# Patient Record
Sex: Female | Born: 1970
Health system: Southern US, Community
[De-identification: ages and names within clinical notes are randomized; demographics above are authoritative.]

## PROBLEM LIST (undated history)

## (undated) DIAGNOSIS — I10 Essential (primary) hypertension: Secondary | ICD-10-CM

## (undated) DIAGNOSIS — K754 Autoimmune hepatitis: Secondary | ICD-10-CM

## (undated) HISTORY — DX: Autoimmune hepatitis: K75.4

## (undated) HISTORY — PX: CHOLECYSTECTOMY: SHX55

## (undated) HISTORY — DX: Essential (primary) hypertension: I10

---

## 2007-06-27 ENCOUNTER — Encounter (INDEPENDENT_AMBULATORY_CARE_PROVIDER_SITE_OTHER): Payer: Self-pay | Admitting: Diagnostic Radiology

## 2007-06-27 ENCOUNTER — Ambulatory Visit (HOSPITAL_COMMUNITY): Admission: RE | Admit: 2007-06-27 | Discharge: 2007-06-27 | Payer: Self-pay | Admitting: Diagnostic Radiology

## 2007-07-03 ENCOUNTER — Encounter: Payer: Self-pay | Admitting: Internal Medicine

## 2007-07-09 ENCOUNTER — Encounter: Payer: Self-pay | Admitting: Internal Medicine

## 2007-07-20 ENCOUNTER — Encounter: Payer: Self-pay | Admitting: Internal Medicine

## 2008-03-21 IMAGING — US US BIOPSY
1 series · 13 of 25 positions shown · non-contrast
Comparison: none

This report is delayed due to PACS failure.
CLINICAL DATA: Acute hepatitis.  
ULTRASOUND GUIDED LIVER BIOPSY 06/27/07:
Medications:  Versed 2 mg, fentanyl 100 mcg.
Procedure:  The procedure was discussed with the patient in depth.  Patient?s INR at the time of the procedure was 1.5. Patient was explained the risks of bleeding for this procedure.  Informed consent was obtained.  A time-out was performed prior to the procedure.  The abdomen was evaluated with ultrasound.  Patient was found to have very echogenic portal venous branches suggestive for underlying liver edema and consistent with hepatitis.  The right hepatic lobe was felt to be most accessible for safe biopsy.  The patient?s right abdomen was prepped and draped in a sterile fashion.  The skin was anesthetized with 1% lidocaine.  A 17-gauge needle was directed into the right hepatic lobe and three core biopsies were obtained with an 18-gauge device through the 17-gauge needle.  The tract was then embolized using gel foam torpedoes.  Patient tolerated the procedure well.  No significant bleeding following the procedure.

[Series 1: unknown · 0.30mm/px · 13 of 31 slices shown]
[im 1/31]
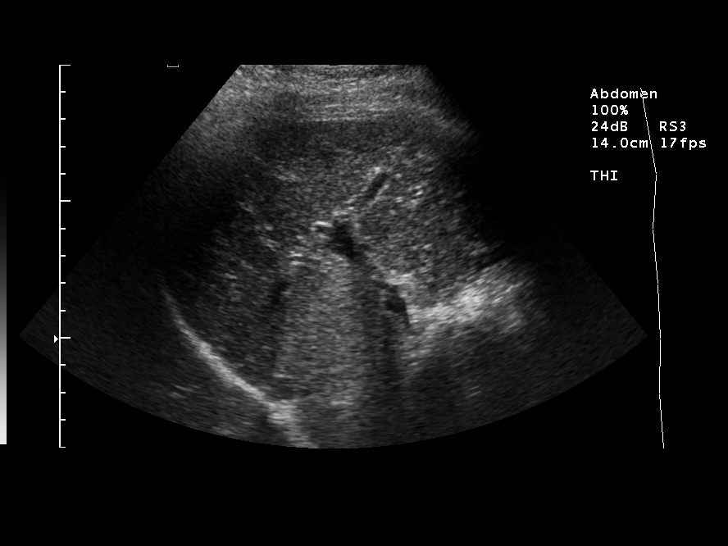
[im 3/31]
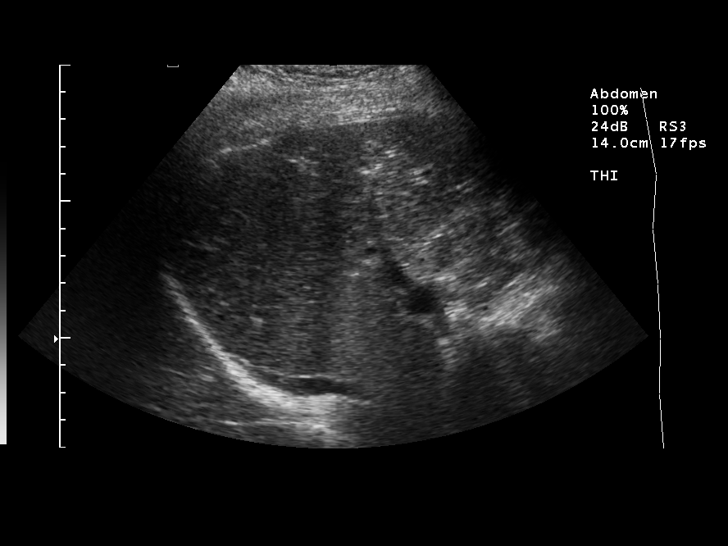
[im 6/31]
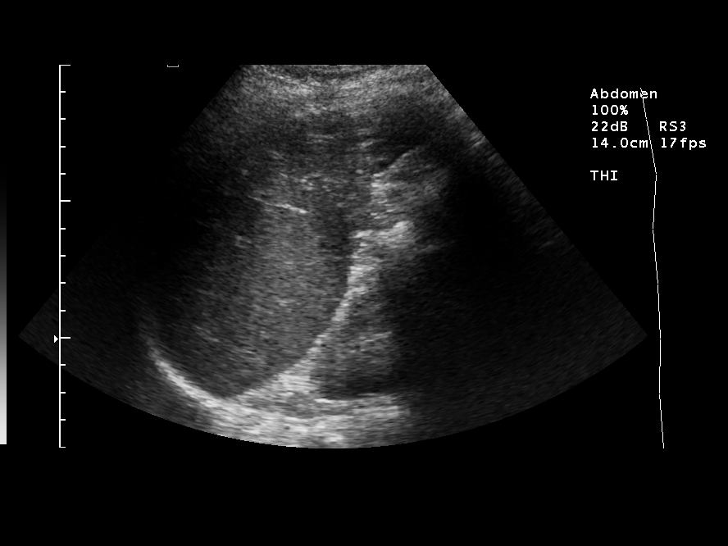
[im 8/31]
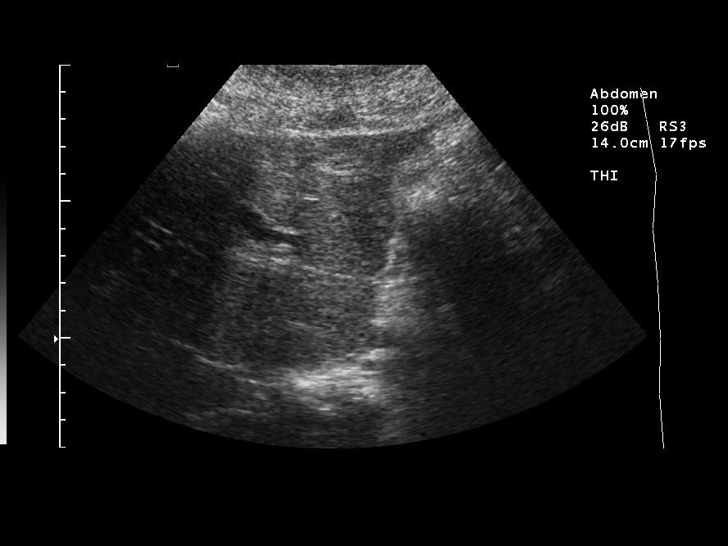
[im 11/31]
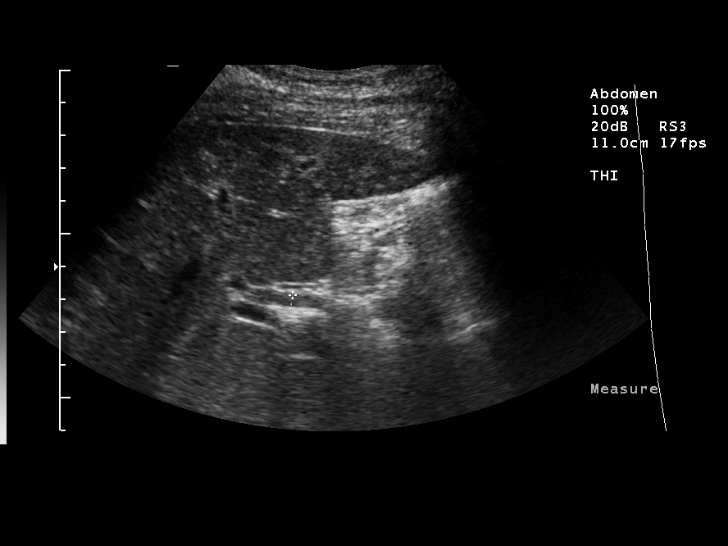
[im 13/31]
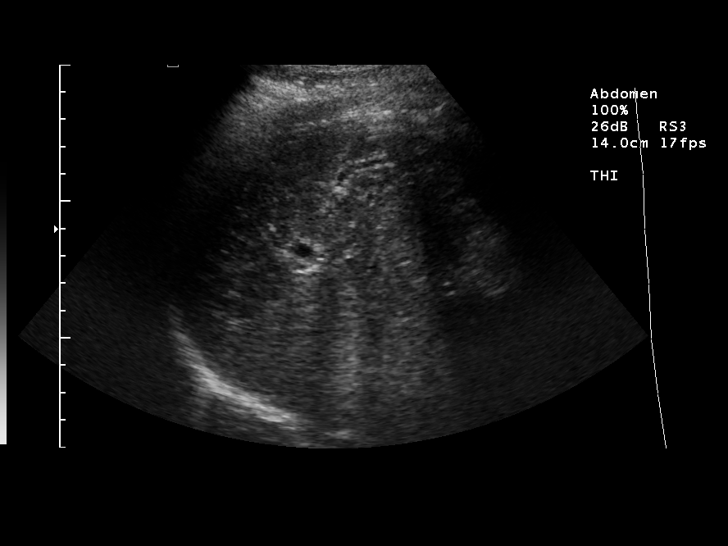
[im 16/31]
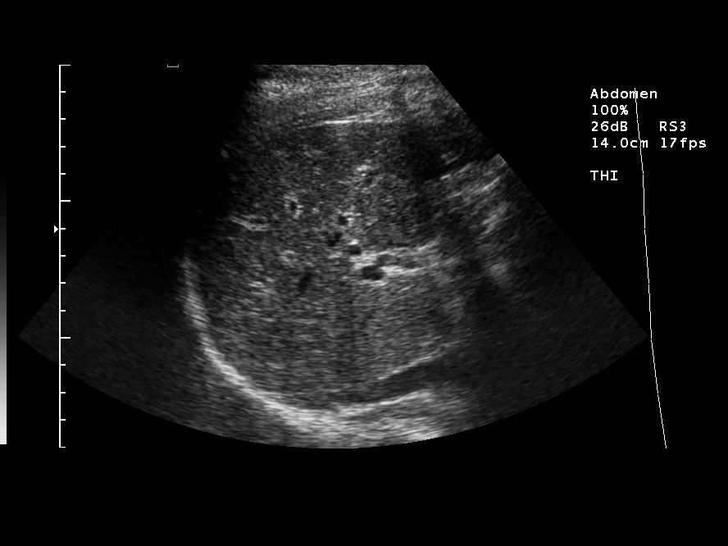
[im 18/31]
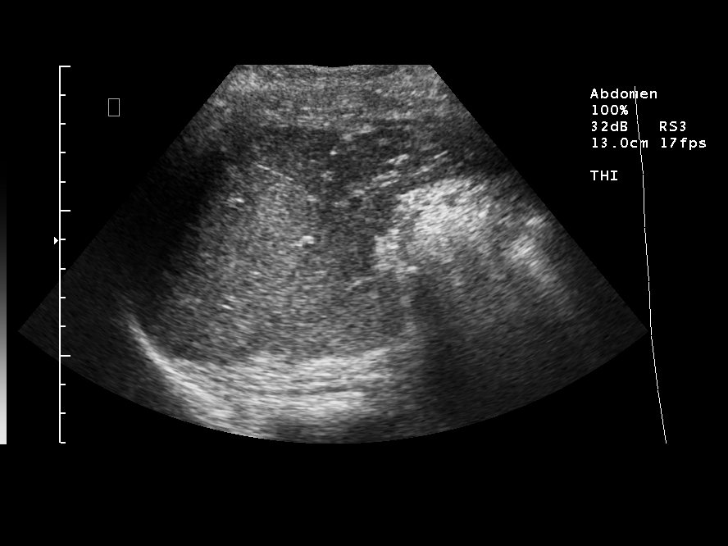
[im 21/31]
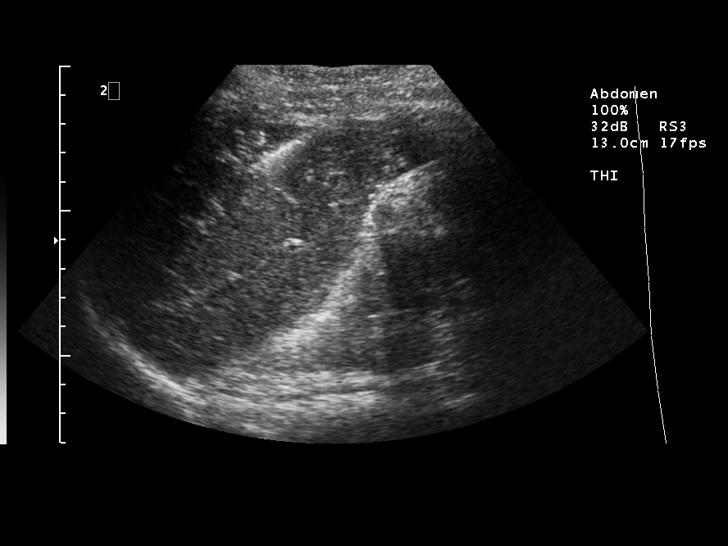
[im 23/31]
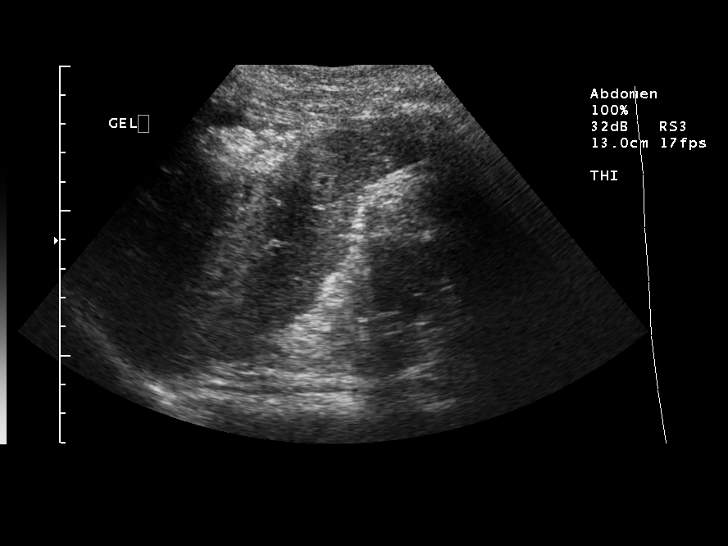
[im 26/31]
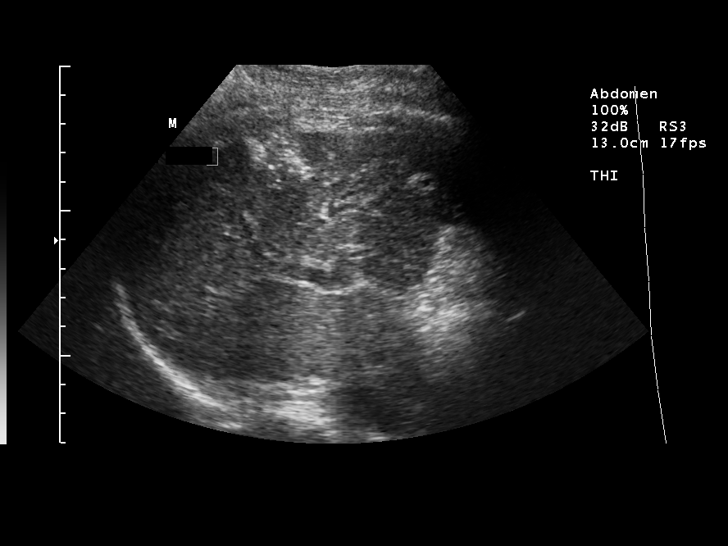
[im 28/31]
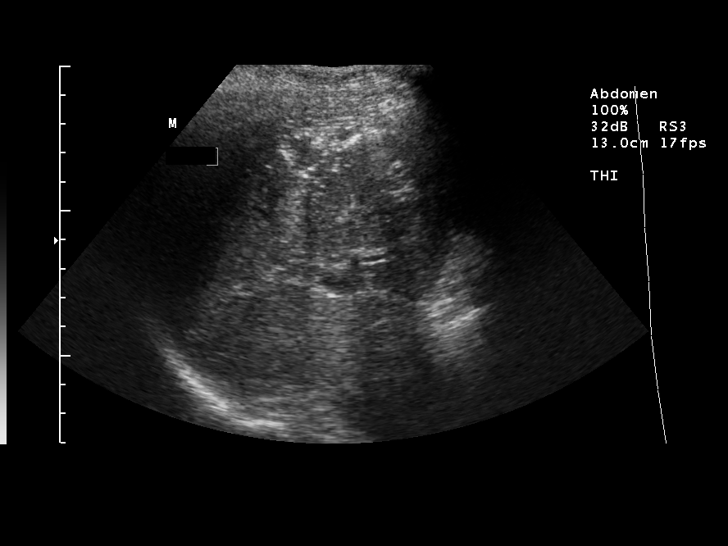
[im 31/31]
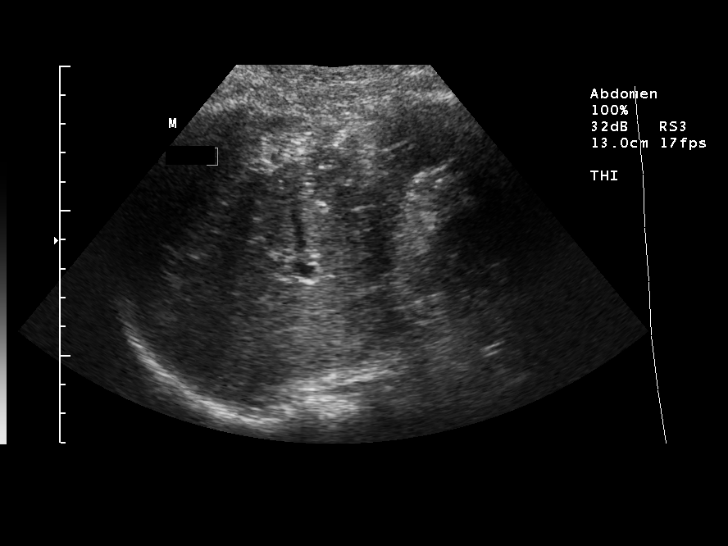

[13 of 25 positions shown; findings below may reference images not displayed]

IMPRESSION: Successful ultrasound guided core biopsies of the right hepatic lobe.  The biopsy tract was embolized with gelfoam.

## 2009-03-03 ENCOUNTER — Encounter: Payer: Self-pay | Admitting: Internal Medicine

## 2011-04-01 LAB — CBC
HCT: 44
Hemoglobin: 15.5 — ABNORMAL HIGH
MCHC: 35.2
MCV: 88.8
Platelets: 274
RBC: 4.96
RDW: 20.7 — ABNORMAL HIGH
WBC: 10.9 — ABNORMAL HIGH

## 2011-04-01 LAB — PROTIME-INR
INR: 1.5
Prothrombin Time: 18.1 — ABNORMAL HIGH

## 2011-12-14 ENCOUNTER — Encounter (INDEPENDENT_AMBULATORY_CARE_PROVIDER_SITE_OTHER): Payer: Self-pay

## 2014-05-20 ENCOUNTER — Encounter: Payer: Self-pay | Admitting: Physician Assistant

## 2016-04-22 ENCOUNTER — Encounter: Payer: Self-pay | Admitting: Physician Assistant

## 2016-04-22 ENCOUNTER — Ambulatory Visit (INDEPENDENT_AMBULATORY_CARE_PROVIDER_SITE_OTHER): Payer: Managed Care, Other (non HMO) | Admitting: Physician Assistant

## 2016-04-22 VITALS — BP 181/114 | HR 60 | Temp 97.8°F | Ht 63.0 in | Wt 149.6 lb

## 2016-04-22 DIAGNOSIS — K754 Autoimmune hepatitis: Secondary | ICD-10-CM | POA: Diagnosis not present

## 2016-04-22 DIAGNOSIS — I10 Essential (primary) hypertension: Secondary | ICD-10-CM | POA: Insufficient documentation

## 2016-04-22 MED ORDER — HYDRALAZINE HCL 10 MG PO TABS: 10.0000 mg | ORAL_TABLET | Freq: Three times a day (TID) | ORAL | 1 refills | Status: DC

## 2016-04-22 NOTE — Progress Notes (Signed)
BP (!) 181/114   Pulse 60   Temp 97.8 F (36.6 C) (Oral)   Ht 5\' 3"  (1.6 m)   Wt 149 lb 9.6 oz (67.9 kg)   LMP 04/22/2016   BMI 26.50 kg/m    Subjective:    Patient ID: Karen Robinson, female    DOB: 1970-07-14, 45 y.o.   MRN: [id number]  HPI: Karen Robinson is a 45 y.o. female presenting on 04/22/2016 for HYPERTENSION. Long term hypertension patient and has had long history of difficulty controlling her blood pressure.  Denies chest pain and SOB and palpitations.  Has had multiple meds over the years. Very strong family history of hypertension.  Calcium Channel Blockers caused severe gingival edema and could not be taken.  Past Medical History:  Diagnosis Date  . Hypertension    Relevant past medical, surgical, family and social history reviewed and updated as indicated. Interim medical history since our last visit reviewed. Allergies and medications reviewed and updated. DATA REVIEWED: CHART IN EPIC  Social History   Social History  . Marital status: Married    Spouse name: N/A  . Number of children: N/A  . Years of education: N/A   Occupational History  . Not on file.   Social History Main Topics  . Smoking status: Never Smoker  . Smokeless tobacco: Never Used  . Alcohol use No  . Drug use: No  . Sexual activity: Not on file   Other Topics Concern  . Not on file   Social History Narrative  . No narrative on file    Past Surgical History:  Procedure Laterality Date  . CESAREAN SECTION    . CHOLECYSTECTOMY     Family History  Problem Relation Age of Onset  . Hypertension Mother   . Hypertension Maternal Grandmother   . Stroke Maternal Grandmother       Review of Systems  Constitutional: Negative.  Negative for activity change, fatigue and fever.  HENT: Negative.   Eyes: Negative.   Respiratory: Negative.  Negative for cough.   Cardiovascular: Negative.  Negative for chest pain.  Gastrointestinal: Negative.  Negative for abdominal pain.    Endocrine: Negative.   Genitourinary: Negative.  Negative for dysuria.  Musculoskeletal: Negative.   Skin: Negative.   Neurological: Negative.       Medication List       Accurate as of 04/22/16  6:17 PM. Always use your most recent med list.          carvedilol 25 MG tablet Commonly known as:  COREG Take 25 mg by mouth 2 (two) times daily with a meal.   EDARBI 80 MG Tabs Generic drug:  Azilsartan Medoxomil Take 80 mg by mouth daily.   hydrALAZINE 10 MG tablet Commonly known as:  APRESOLINE Take 1 tablet (10 mg total) by mouth 3 (three) times daily.   hydrochlorothiazide 25 MG tablet Commonly known as:  HYDRODIURIL Take 25 mg by mouth daily.          Objective:    BP (!) 181/114   Pulse 60   Temp 97.8 F (36.6 C) (Oral)   Ht 5\' 3"  (1.6 m)   Wt 149 lb 9.6 oz (67.9 kg)   LMP 04/22/2016   BMI 26.50 kg/m   No Known Allergies  Wt Readings from Last 3 Encounters:  04/22/16 149 lb 9.6 oz (67.9 kg)    Physical Exam  Constitutional: She is oriented to person, place, and time. She appears well-developed and  well-nourished.  HENT:  Head: Normocephalic and atraumatic.  Eyes: Conjunctivae and EOM are normal. Pupils are equal, round, and reactive to light.  Cardiovascular: Normal rate, regular rhythm, normal heart sounds and intact distal pulses.   Pulmonary/Chest: Effort normal and breath sounds normal.  Abdominal: Soft. Bowel sounds are normal.  Neurological: She is alert and oriented to person, place, and time. She has normal reflexes.  Skin: Skin is warm and dry. No rash noted.  Psychiatric: She has a normal mood and affect. Her behavior is normal. Judgment and thought content normal.  Nursing note and vitals reviewed.       Assessment & Plan:   1. Essential hypertension - hydrALAZINE (APRESOLINE) 10 MG tablet; Take 1 tablet (10 mg total) by mouth 3 (three) times daily.  Dispense: 90 tablet; Refill: 1 Continue edarbi 80 mg one daily HCTZ 25 mg one  daily Carvedilol 25 mg one BID  Continue all other maintenance medications as listed above.  Follow up plan: Return in about 4 weeks (around 05/20/2016).  Educational handout given for hypertension  Remus LofflerAngel S. Lillien Petronio PA-C [facility] [address]  [place], [place] [id number] [phone]   04/22/2016, 6:17 PM

## 2016-04-22 NOTE — Patient Instructions (Signed)
Hypertension Hypertension, commonly called high blood pressure, is when the force of blood pumping through your arteries is too strong. Your arteries are the blood vessels that carry blood from your heart throughout your body. A blood pressure reading consists of a higher number over a lower number, such as 110/72. The higher number (systolic) is the pressure inside your arteries when your heart pumps. The lower number (diastolic) is the pressure inside your arteries when your heart relaxes. Ideally you want your blood pressure below 120/80. Hypertension forces your heart to work harder to pump blood. Your arteries may become narrow or stiff. Having untreated or uncontrolled hypertension can cause heart attack, stroke, kidney disease, and other problems. RISK FACTORS Some risk factors for high blood pressure are controllable. Others are not.  Risk factors you cannot control include:   Race. You may be at higher risk if you are African American.  Age. Risk increases with age.  Gender. Men are at higher risk than women before age 45 years. After age 65, women are at higher risk than men. Risk factors you can control include:  Not getting enough exercise or physical activity.  Being overweight.  Getting too much fat, sugar, calories, or salt in your diet.  Drinking too much alcohol. SIGNS AND SYMPTOMS Hypertension does not usually cause signs or symptoms. Extremely high blood pressure (hypertensive crisis) may cause headache, anxiety, shortness of breath, and nosebleed. DIAGNOSIS To check if you have hypertension, your health care provider will measure your blood pressure while you are seated, with your arm held at the level of your heart. It should be measured at least twice using the same arm. Certain conditions can cause a difference in blood pressure between your right and left arms. A blood pressure reading that is higher than normal on one occasion does not mean that you need treatment. If  it is not clear whether you have high blood pressure, you may be asked to return on a different day to have your blood pressure checked again. Or, you may be asked to monitor your blood pressure at home for 1 or more weeks. TREATMENT Treating high blood pressure includes making lifestyle changes and possibly taking medicine. Living a healthy lifestyle can help lower high blood pressure. You may need to change some of your habits. Lifestyle changes may include:  Following the DASH diet. This diet is high in fruits, vegetables, and whole grains. It is low in salt, red meat, and added sugars.  Keep your sodium intake below 2,300 mg per day.  Getting at least 30-45 minutes of aerobic exercise at least 4 times per week.  Losing weight if necessary.  Not smoking.  Limiting alcoholic beverages.  Learning ways to reduce stress. Your health care provider may prescribe medicine if lifestyle changes are not enough to get your blood pressure under control, and if one of the following is true:  You are 18-59 years of age and your systolic blood pressure is above 140.  You are 60 years of age or older, and your systolic blood pressure is above 150.  Your diastolic blood pressure is above 90.  You have diabetes, and your systolic blood pressure is over 140 or your diastolic blood pressure is over 90.  You have kidney disease and your blood pressure is above 140/90.  You have heart disease and your blood pressure is above 140/90. Your personal target blood pressure may vary depending on your medical conditions, your age, and other factors. HOME CARE INSTRUCTIONS    Have your blood pressure rechecked as directed by your health care provider.   Take medicines only as directed by your health care provider. Follow the directions carefully. Blood pressure medicines must be taken as prescribed. The medicine does not work as well when you skip doses. Skipping doses also puts you at risk for  problems.  Do not smoke.   Monitor your blood pressure at home as directed by your health care provider. SEEK MEDICAL CARE IF:   You think you are having a reaction to medicines taken.  You have recurrent headaches or feel dizzy.  You have swelling in your ankles.  You have trouble with your vision. SEEK IMMEDIATE MEDICAL CARE IF:  You develop a severe headache or confusion.  You have unusual weakness, numbness, or feel faint.  You have severe chest or abdominal pain.  You vomit repeatedly.  You have trouble breathing. MAKE SURE YOU:   Understand these instructions.  Will watch your condition.  Will get help right away if you are not doing well or get worse.   This information is not intended to replace advice given to you by your health care provider. Make sure you discuss any questions you have with your health care provider.   Document Released: 06/13/2005 Document Revised: 10/28/2014 Document Reviewed: 04/05/2013 Elsevier Interactive Patient Education 2016 Elsevier Inc.  

## 2016-05-02 ENCOUNTER — Encounter: Payer: Self-pay | Admitting: Physician Assistant

## 2016-05-24 ENCOUNTER — Ambulatory Visit: Payer: Managed Care, Other (non HMO) | Admitting: Physician Assistant

## 2016-05-25 ENCOUNTER — Encounter: Payer: Self-pay | Admitting: Physician Assistant

## 2016-05-25 ENCOUNTER — Telehealth: Payer: Self-pay | Admitting: Physician Assistant

## 2016-05-26 NOTE — Telephone Encounter (Signed)
Patient forgot about appointment she had a death in the family and moved to 3rd shift.

## 2016-07-08 ENCOUNTER — Other Ambulatory Visit: Payer: Self-pay | Admitting: Physician Assistant

## 2016-07-08 DIAGNOSIS — I10 Essential (primary) hypertension: Secondary | ICD-10-CM

## 2016-08-08 ENCOUNTER — Other Ambulatory Visit: Payer: Self-pay | Admitting: Physician Assistant

## 2016-08-22 ENCOUNTER — Other Ambulatory Visit: Payer: Self-pay | Admitting: *Deleted

## 2016-08-22 MED ORDER — HYDROCHLOROTHIAZIDE 25 MG PO TABS: 25.0000 mg | ORAL_TABLET | Freq: Every day | ORAL | 1 refills | Status: DC

## 2016-08-23 ENCOUNTER — Other Ambulatory Visit: Payer: Self-pay | Admitting: Physician Assistant

## 2016-10-10 ENCOUNTER — Other Ambulatory Visit: Payer: Self-pay | Admitting: Physician Assistant

## 2016-11-07 ENCOUNTER — Other Ambulatory Visit: Payer: Self-pay | Admitting: Physician Assistant

## 2016-12-07 ENCOUNTER — Telehealth: Payer: Self-pay | Admitting: Physician Assistant

## 2016-12-07 ENCOUNTER — Other Ambulatory Visit: Payer: Self-pay | Admitting: Physician Assistant

## 2016-12-07 ENCOUNTER — Other Ambulatory Visit: Payer: Self-pay

## 2016-12-07 MED ORDER — HYDROCHLOROTHIAZIDE 25 MG PO TABS: 25.0000 mg | ORAL_TABLET | Freq: Every day | ORAL | 0 refills | Status: DC

## 2016-12-07 MED ORDER — CARVEDILOL 25 MG PO TABS
25.0000 mg | ORAL_TABLET | Freq: Two times a day (BID) | ORAL | 0 refills | Status: DC
Start: 2016-12-07 — End: 2016-12-12

## 2016-12-07 NOTE — Telephone Encounter (Signed)
Please advise on refills.  

## 2016-12-07 NOTE — Telephone Encounter (Signed)
Yes, whatever needs to be sent.

## 2016-12-07 NOTE — Telephone Encounter (Signed)
Rxs were sent to the pharmacy. Patient notified

## 2016-12-07 NOTE — Telephone Encounter (Signed)
Meds sent to pharmacy and patient notified. 

## 2016-12-08 ENCOUNTER — Other Ambulatory Visit: Payer: Self-pay | Admitting: Physician Assistant

## 2016-12-09 ENCOUNTER — Other Ambulatory Visit: Payer: Self-pay | Admitting: *Deleted

## 2016-12-09 MED ORDER — AZILSARTAN MEDOXOMIL 80 MG PO TABS: 1.0000 | ORAL_TABLET | Freq: Every day | ORAL | 0 refills | Status: DC

## 2016-12-09 NOTE — Progress Notes (Signed)
[facility] Okayed per Prudy FeelerAngel Jones

## 2016-12-09 NOTE — Telephone Encounter (Signed)
RX sent into pharmacy per pt request

## 2016-12-12 ENCOUNTER — Ambulatory Visit (INDEPENDENT_AMBULATORY_CARE_PROVIDER_SITE_OTHER): Payer: Managed Care, Other (non HMO) | Admitting: Physician Assistant

## 2016-12-12 ENCOUNTER — Encounter: Payer: Self-pay | Admitting: Physician Assistant

## 2016-12-12 VITALS — BP 150/83 | HR 52 | Temp 97.1°F | Ht 63.0 in | Wt 162.2 lb

## 2016-12-12 DIAGNOSIS — M533 Sacrococcygeal disorders, not elsewhere classified: Secondary | ICD-10-CM | POA: Diagnosis not present

## 2016-12-12 DIAGNOSIS — I10 Essential (primary) hypertension: Secondary | ICD-10-CM

## 2016-12-12 MED ORDER — AZILSARTAN MEDOXOMIL 80 MG PO TABS: 1.0000 | ORAL_TABLET | Freq: Every day | ORAL | 3 refills | Status: DC

## 2016-12-12 MED ORDER — HYDROCHLOROTHIAZIDE 25 MG PO TABS: 25.0000 mg | ORAL_TABLET | Freq: Every day | ORAL | 3 refills | Status: DC

## 2016-12-12 MED ORDER — CARVEDILOL 25 MG PO TABS: 25.0000 mg | ORAL_TABLET | Freq: Two times a day (BID) | ORAL | 3 refills | Status: DC

## 2016-12-12 MED ORDER — HYDRALAZINE HCL 25 MG PO TABS: 25.0000 mg | ORAL_TABLET | Freq: Three times a day (TID) | ORAL | 2 refills | Status: DC

## 2016-12-12 NOTE — Patient Instructions (Signed)
DASH Eating Plan DASH stands for "Dietary Approaches to Stop Hypertension." The DASH eating plan is a healthy eating plan that has been shown to reduce high blood pressure (hypertension). It may also reduce your risk for type 2 diabetes, heart disease, and stroke. The DASH eating plan may also help with weight loss. What are tips for following this plan? General guidelines  Avoid eating more than 2,300 mg (milligrams) of salt (sodium) a day. If you have hypertension, you may need to reduce your sodium intake to 1,500 mg a day.  Limit alcohol intake to no more than 1 drink a day for nonpregnant women and 2 drinks a day for men. One drink equals 12 oz of beer, 5 oz of wine, or 1 oz of hard liquor.  Work with your health care provider to maintain a healthy body weight or to lose weight. Ask what an ideal weight is for you.  Get at least 30 minutes of exercise that causes your heart to beat faster (aerobic exercise) most days of the week. Activities may include walking, swimming, or biking.  Work with your health care provider or diet and nutrition specialist (dietitian) to adjust your eating plan to your individual calorie needs. Reading food labels  Check food labels for the amount of sodium per serving. Choose foods with less than 5 percent of the Daily Value of sodium. Generally, foods with less than 300 mg of sodium per serving fit into this eating plan.  To find whole grains, look for the word "whole" as the first word in the ingredient list. Shopping  Buy products labeled as "low-sodium" or "no salt added."  Buy fresh foods. Avoid canned foods and premade or frozen meals. Cooking  Avoid adding salt when cooking. Use salt-free seasonings or herbs instead of table salt or sea salt. Check with your health care provider or pharmacist before using salt substitutes.  Do not fry foods. Cook foods using healthy methods such as baking, boiling, grilling, and broiling instead.  Cook with  heart-healthy oils, such as olive, canola, soybean, or sunflower oil. Meal planning   Eat a balanced diet that includes: ? 5 or more servings of fruits and vegetables each day. At each meal, try to fill half of your plate with fruits and vegetables. ? Up to 6-8 servings of whole grains each day. ? Less than 6 oz of lean meat, poultry, or fish each day. A 3-oz serving of meat is about the same size as a deck of cards. One egg equals 1 oz. ? 2 servings of low-fat dairy each day. ? A serving of nuts, seeds, or beans 5 times each week. ? Heart-healthy fats. Healthy fats called Omega-3 fatty acids are found in foods such as flaxseeds and coldwater fish, like sardines, salmon, and mackerel.  Limit how much you eat of the following: ? Canned or prepackaged foods. ? Food that is high in trans fat, such as fried foods. ? Food that is high in saturated fat, such as fatty meat. ? Sweets, desserts, sugary drinks, and other foods with added sugar. ? Full-fat dairy products.  Do not salt foods before eating.  Try to eat at least 2 vegetarian meals each week.  Eat more home-cooked food and less restaurant, buffet, and fast food.  When eating at a restaurant, ask that your food be prepared with less salt or no salt, if possible. What foods are recommended? The items listed may not be a complete list. Talk with your dietitian about what   dietary choices are best for you. Grains Whole-grain or whole-wheat bread. Whole-grain or whole-wheat pasta. Brown rice. Oatmeal. Quinoa. Bulgur. Whole-grain and low-sodium cereals. Pita bread. Low-fat, low-sodium crackers. Whole-wheat flour tortillas. Vegetables Fresh or frozen vegetables (raw, steamed, roasted, or grilled). Low-sodium or reduced-sodium tomato and vegetable juice. Low-sodium or reduced-sodium tomato sauce and tomato paste. Low-sodium or reduced-sodium canned vegetables. Fruits All fresh, dried, or frozen fruit. Canned fruit in natural juice (without  added sugar). Meat and other protein foods Skinless chicken or turkey. Ground chicken or turkey. Pork with fat trimmed off. Fish and seafood. Egg whites. Dried beans, peas, or lentils. Unsalted nuts, nut butters, and seeds. Unsalted canned beans. Lean cuts of beef with fat trimmed off. Low-sodium, lean deli meat. Dairy Low-fat (1%) or fat-free (skim) milk. Fat-free, low-fat, or reduced-fat cheeses. Nonfat, low-sodium ricotta or cottage cheese. Low-fat or nonfat yogurt. Low-fat, low-sodium cheese. Fats and oils Soft margarine without trans fats. Vegetable oil. Low-fat, reduced-fat, or light mayonnaise and salad dressings (reduced-sodium). Canola, safflower, olive, soybean, and sunflower oils. Avocado. Seasoning and other foods Herbs. Spices. Seasoning mixes without salt. Unsalted popcorn and pretzels. Fat-free sweets. What foods are not recommended? The items listed may not be a complete list. Talk with your dietitian about what dietary choices are best for you. Grains Baked goods made with fat, such as croissants, muffins, or some breads. Dry pasta or rice meal packs. Vegetables Creamed or fried vegetables. Vegetables in a cheese sauce. Regular canned vegetables (not low-sodium or reduced-sodium). Regular canned tomato sauce and paste (not low-sodium or reduced-sodium). Regular tomato and vegetable juice (not low-sodium or reduced-sodium). Pickles. Olives. Fruits Canned fruit in a light or heavy syrup. Fried fruit. Fruit in cream or butter sauce. Meat and other protein foods Fatty cuts of meat. Ribs. Fried meat. Bacon. Sausage. Bologna and other processed lunch meats. Salami. Fatback. Hotdogs. Bratwurst. Salted nuts and seeds. Canned beans with added salt. Canned or smoked fish. Whole eggs or egg yolks. Chicken or turkey with skin. Dairy Whole or 2% milk, cream, and half-and-half. Whole or full-fat cream cheese. Whole-fat or sweetened yogurt. Full-fat cheese. Nondairy creamers. Whipped toppings.  Processed cheese and cheese spreads. Fats and oils Butter. Stick margarine. Lard. Shortening. Ghee. Bacon fat. Tropical oils, such as coconut, palm kernel, or palm oil. Seasoning and other foods Salted popcorn and pretzels. Onion salt, garlic salt, seasoned salt, table salt, and sea salt. Worcestershire sauce. Tartar sauce. Barbecue sauce. Teriyaki sauce. Soy sauce, including reduced-sodium. Steak sauce. Canned and packaged gravies. Fish sauce. Oyster sauce. Cocktail sauce. Horseradish that you find on the shelf. Ketchup. Mustard. Meat flavorings and tenderizers. Bouillon cubes. Hot sauce and Tabasco sauce. Premade or packaged marinades. Premade or packaged taco seasonings. Relishes. Regular salad dressings. Where to find more information:  National Heart, Lung, and Blood Institute: www.nhlbi.nih.gov  American Heart Association: www.heart.org Summary  The DASH eating plan is a healthy eating plan that has been shown to reduce high blood pressure (hypertension). It may also reduce your risk for type 2 diabetes, heart disease, and stroke.  With the DASH eating plan, you should limit salt (sodium) intake to 2,300 mg a day. If you have hypertension, you may need to reduce your sodium intake to 1,500 mg a day.  When on the DASH eating plan, aim to eat more fresh fruits and vegetables, whole grains, lean proteins, low-fat dairy, and heart-healthy fats.  Work with your health care provider or diet and nutrition specialist (dietitian) to adjust your eating plan to your individual   calorie needs. This information is not intended to replace advice given to you by your health care provider. Make sure you discuss any questions you have with your health care provider. Document Released: 06/02/2011 Document Revised: 06/06/2016 Document Reviewed: 06/06/2016 Elsevier Interactive Patient Education  2017 Elsevier Inc.  

## 2016-12-12 NOTE — Progress Notes (Signed)
BP (!) 150/83   Pulse (!) 52   Temp 97.1 F (36.2 C) (Oral)   Ht '5\' 3"'  (1.6 m)   Wt 162 lb 3.2 oz (73.6 kg)   BMI 28.73 kg/m    Subjective:    Patient ID: Karen Robinson, female    DOB: [birth year], 46 y.o.   MRN: [id number]  HPI: Karen Robinson is a 46 y.o. female presenting on 12/12/2016 for Hypertension  This patient comes in for periodic recheck on medications and conditions including hypertension, malignant. She has had an improvement in reading since starting the Hydralazine 10 mg 3 times a day. She states she is tolerating it well. We will try to increase the medication at this time. All of her other medications will be refilled. She is due lab work today..   The patient is a very good job of losing weight. However since then she has not have much tissue in her buttock area. She has pain every time she sits for very long but at the coccyx area. She's never had an injury to this area.  All medications are reviewed today. There are no reports of any problems with the medications. All of the medical conditions are reviewed and updated.  Lab work is reviewed and will be ordered as medically necessary. There are no new problems reported with today's visit.   Relevant past medical, surgical, family and social history reviewed and updated as indicated. Allergies and medications reviewed and updated.  Past Medical History:  Diagnosis Date  . Autoimmune hepatitis ([place])   . Hypertension     Past Surgical History:  Procedure Laterality Date  . CESAREAN SECTION    . CHOLECYSTECTOMY      Review of Systems  Constitutional: Negative.  Negative for activity change, fatigue and fever.  HENT: Negative.   Eyes: Negative.   Respiratory: Negative.  Negative for cough.   Cardiovascular: Negative.  Negative for chest pain.  Gastrointestinal: Negative.  Negative for abdominal pain.  Endocrine: Negative.   Genitourinary: Negative.  Negative for dysuria.  Musculoskeletal: Positive  for back pain.  Skin: Negative.   Neurological: Negative.     Allergies as of 12/12/2016   No Known Allergies     Medication List       Accurate as of 12/12/16 12:21 PM. Always use your most recent med list.          Azilsartan Medoxomil 80 MG Tabs Commonly known as:  EDARBI Take 1 tablet (80 mg total) by mouth daily.   carvedilol 25 MG tablet Commonly known as:  COREG Take 1 tablet (25 mg total) by mouth 2 (two) times daily.   hydrALAZINE 25 MG tablet Commonly known as:  APRESOLINE Take 1 tablet (25 mg total) by mouth 3 (three) times daily.   hydrochlorothiazide 25 MG tablet Commonly known as:  HYDRODIURIL Take 1 tablet (25 mg total) by mouth daily.          Objective:    BP (!) 150/83   Pulse (!) 52   Temp 97.1 F (36.2 C) (Oral)   Ht '5\' 3"'  (1.6 m)   Wt 162 lb 3.2 oz (73.6 kg)   BMI 28.73 kg/m   No Known Allergies  Physical Exam  Constitutional: She is oriented to person, place, and time. She appears well-developed and well-nourished.  HENT:  Head: Normocephalic and atraumatic.  Eyes: Conjunctivae and EOM are normal. Pupils are equal, round, and reactive to light.  Cardiovascular: Normal rate, regular rhythm,  normal heart sounds and intact distal pulses.   Pulmonary/Chest: Effort normal and breath sounds normal.  Abdominal: Soft. Bowel sounds are normal.  Neurological: She is alert and oriented to person, place, and time. She has normal reflexes.  Skin: Skin is warm and dry. No rash noted.  Psychiatric: She has a normal mood and affect. Her behavior is normal. Judgment and thought content normal.        Assessment & Plan:   1. Essential hypertension - hydrALAZINE (APRESOLINE) 25 MG tablet; Take 1 tablet (25 mg total) by mouth 3 (three) times daily.  Dispense: 90 tablet; Refill: 2 - CMP14+EGFR - CBC with Differential/Platelet  2. HTN (hypertension), malignant  3. Coccydynia   Current Outpatient Prescriptions:  .  Azilsartan Medoxomil  (EDARBI) 80 MG TABS, Take 1 tablet (80 mg total) by mouth daily., Disp: 90 tablet, Rfl: 3 .  carvedilol (COREG) 25 MG tablet, Take 1 tablet (25 mg total) by mouth 2 (two) times daily., Disp: 180 tablet, Rfl: 3 .  hydrALAZINE (APRESOLINE) 25 MG tablet, Take 1 tablet (25 mg total) by mouth 3 (three) times daily., Disp: 90 tablet, Rfl: 2 .  hydrochlorothiazide (HYDRODIURIL) 25 MG tablet, Take 1 tablet (25 mg total) by mouth daily., Disp: 90 tablet, Rfl: 3  Continue all other maintenance medications as listed above.  Follow up plan: Return in about 3 months (around 03/14/2017) for recheck.  Educational handout given for [place] PA-C [facility] [address]  [place], [place] [address] [phone]   12/12/2016, 12:21 PM

## 2016-12-13 LAB — CBC WITH DIFFERENTIAL/PLATELET
Basophils Absolute: 0 10*3/uL (ref 0.0–0.2)
Basos: 0 %
EOS (ABSOLUTE): 0.1 10*3/uL (ref 0.0–0.4)
Eos: 2 %
Hematocrit: 34.1 % (ref 34.0–46.6)
Hemoglobin: 11.6 g/dL (ref 11.1–15.9)
Immature Grans (Abs): 0 10*3/uL (ref 0.0–0.1)
Immature Granulocytes: 0 %
Lymphocytes Absolute: 1.7 10*3/uL (ref 0.7–3.1)
Lymphs: 28 %
MCH: 31.1 pg (ref 26.6–33.0)
MCHC: 34 g/dL (ref 31.5–35.7)
MCV: 91 fL (ref 79–97)
Monocytes Absolute: 0.5 10*3/uL (ref 0.1–0.9)
Monocytes: 9 %
Neutrophils Absolute: 3.6 10*3/uL (ref 1.4–7.0)
Neutrophils: 61 %
Platelets: 198 10*3/uL (ref 150–379)
RBC: 3.73 x10E6/uL — ABNORMAL LOW (ref 3.77–5.28)
RDW: 13.9 % (ref 12.3–15.4)
WBC: 6 10*3/uL (ref 3.4–10.8)

## 2016-12-13 LAB — CMP14+EGFR
ALT: 18 IU/L (ref 0–32)
AST: 26 IU/L (ref 0–40)
Albumin/Globulin Ratio: 1.6 (ref 1.2–2.2)
Albumin: 4.2 g/dL (ref 3.5–5.5)
Alkaline Phosphatase: 70 IU/L (ref 39–117)
BUN/Creatinine Ratio: 10 (ref 9–23)
BUN: 10 mg/dL (ref 6–24)
Bilirubin Total: <0.2 mg/dL (ref 0.0–1.2)
CO2: 25 mmol/L (ref 20–29)
Calcium: 8.9 mg/dL (ref 8.7–10.2)
Chloride: 104 mmol/L (ref 96–106)
Creatinine, Ser: 0.99 mg/dL (ref 0.57–1.00)
GFR calc Af Amer: 79 mL/min/{1.73_m2} (ref >59)
GFR calc non Af Amer: 69 mL/min/{1.73_m2} (ref >59)
Globulin, Total: 2.6 g/dL (ref 1.5–4.5)
Glucose: 87 mg/dL (ref 65–99)
Potassium: 3.3 mmol/L — ABNORMAL LOW (ref 3.5–5.2)
Sodium: 145 mmol/L — ABNORMAL HIGH (ref 134–144)
Total Protein: 6.8 g/dL (ref 6.0–8.5)

## 2016-12-19 ENCOUNTER — Telehealth: Payer: Self-pay | Admitting: Physician Assistant

## 2016-12-19 NOTE — Telephone Encounter (Signed)
Pt aware of normal results Suggested increase potassium rich foods

## 2016-12-19 NOTE — Telephone Encounter (Signed)
I just sent you the result note that has no RESULTS in the box.

## 2016-12-19 NOTE — Telephone Encounter (Signed)
This person's labs are not in my result folder.

## 2017-03-14 ENCOUNTER — Ambulatory Visit: Payer: Managed Care, Other (non HMO) | Admitting: Physician Assistant

## 2017-03-14 ENCOUNTER — Encounter: Payer: Self-pay | Admitting: Physician Assistant

## 2017-05-09 ENCOUNTER — Other Ambulatory Visit: Payer: Self-pay | Admitting: Physician Assistant

## 2017-05-09 DIAGNOSIS — I10 Essential (primary) hypertension: Secondary | ICD-10-CM

## 2017-06-19 ENCOUNTER — Other Ambulatory Visit: Payer: Self-pay | Admitting: Physician Assistant

## 2017-06-19 DIAGNOSIS — I10 Essential (primary) hypertension: Secondary | ICD-10-CM

## 2017-06-21 NOTE — Telephone Encounter (Signed)
[facility] seen 12/12/16  [facility]

## 2017-07-24 ENCOUNTER — Encounter: Payer: Self-pay | Admitting: Physician Assistant

## 2017-07-24 ENCOUNTER — Ambulatory Visit (INDEPENDENT_AMBULATORY_CARE_PROVIDER_SITE_OTHER): Payer: Managed Care, Other (non HMO) | Admitting: Physician Assistant

## 2017-07-24 VITALS — BP 146/84 | HR 61 | Temp 98.9°F | Ht 63.0 in | Wt 161.2 lb

## 2017-07-24 DIAGNOSIS — G4726 Circadian rhythm sleep disorder, shift work type: Secondary | ICD-10-CM | POA: Insufficient documentation

## 2017-07-24 DIAGNOSIS — I10 Essential (primary) hypertension: Secondary | ICD-10-CM

## 2017-07-24 DIAGNOSIS — K754 Autoimmune hepatitis: Secondary | ICD-10-CM

## 2017-07-24 MED ORDER — HYDRALAZINE HCL 25 MG PO TABS: 25.0000 mg | ORAL_TABLET | Freq: Three times a day (TID) | ORAL | 11 refills | Status: DC

## 2017-07-24 MED ORDER — ZOLPIDEM TARTRATE 5 MG PO TABS: 5.0000 mg | ORAL_TABLET | Freq: Every evening | ORAL | 5 refills | Status: DC | PRN

## 2017-07-24 NOTE — Patient Instructions (Signed)
In a few days you may receive a survey in the mail or online from [employer] regarding your visit with us today. Please take a moment to fill this out. Your feedback is very important to our whole office. It can help us better understand your needs as well as improve your experience and satisfaction. Thank you for taking your time to complete it. We care about you.  Zareena Willis, PA-C  

## 2017-07-25 ENCOUNTER — Telehealth: Payer: Self-pay | Admitting: Physician Assistant

## 2017-07-25 NOTE — Telephone Encounter (Signed)
Patient just wants to verfiy that it is ok for her to take Ambien with the autoimmune hepatitis?

## 2017-07-25 NOTE — Telephone Encounter (Signed)
Patient aware.

## 2017-07-25 NOTE — Progress Notes (Signed)
BP (!) 146/84   Pulse 61   Temp 98.9 F (37.2 C) (Oral)   Ht '5\' 3"'  (1.6 m)   Wt 161 lb 3.2 oz (73.1 kg)   BMI 28.56 kg/m    Subjective:    Patient ID: Karen Robinson, female    DOB: 12/29/1970, 47 y.o.   MRN: [id number]  HPI: Karen Robinson is a 47 y.o. female presenting on 07/24/2017 for Hypertension and Insomnia (x 1 year- works 3rd shift and can not adjust )  This patient comes in for periodic recheck on medications and conditions including hypertension and autoimmune hepatitis.  She needs a referral back to Dr. Laural Golden.  She has been followed through [facility] hepatology.  She is done very well.  She would like to get back to the gastroenterologist here also.  She is also having to work nights.  She is having a very hard time with going to sleep when she comes in in the mornings.  She does have to take her son to school and then try to settle down.  She is getting at most 2 hours in a clip..   All medications are reviewed today. There are no reports of any problems with the medications. All of the medical conditions are reviewed and updated.  Lab work is reviewed and will be ordered as medically necessary. There are no new problems reported with today's visit.  Relevant past medical, surgical, family and social history reviewed and updated as indicated. Allergies and medications reviewed and updated.  Past Medical History:  Diagnosis Date  . Autoimmune hepatitis ([place])   . Hypertension     Past Surgical History:  Procedure Laterality Date  . CESAREAN SECTION    . CHOLECYSTECTOMY      Review of Systems  Constitutional: Negative.  Negative for activity change, fatigue and fever.  HENT: Negative.   Eyes: Negative.   Respiratory: Negative.  Negative for cough.   Cardiovascular: Negative.  Negative for chest pain.  Gastrointestinal: Negative for abdominal distention, abdominal pain, constipation, diarrhea and nausea.  Endocrine: Negative.   Genitourinary: Negative.  Negative  for dysuria.  Musculoskeletal: Negative.   Skin: Negative.   Neurological: Negative.   Psychiatric/Behavioral: Positive for sleep disturbance.    Allergies as of 07/24/2017   No Known Allergies     Medication List        Accurate as of 07/24/17 11:59 PM. Always use your most recent med list.          Azilsartan Medoxomil 80 MG Tabs Commonly known as:  EDARBI Take 1 tablet (80 mg total) by mouth daily.   carvedilol 25 MG tablet Commonly known as:  COREG Take 1 tablet (25 mg total) by mouth 2 (two) times daily.   hydrALAZINE 25 MG tablet Commonly known as:  APRESOLINE Take 1 tablet (25 mg total) by mouth 3 (three) times daily.   hydrochlorothiazide 25 MG tablet Commonly known as:  HYDRODIURIL Take 1 tablet (25 mg total) by mouth daily.   zolpidem 5 MG tablet Commonly known as:  AMBIEN Take 1 tablet (5 mg total) by mouth at bedtime as needed for sleep.          Objective:    BP (!) 146/84   Pulse 61   Temp 98.9 F (37.2 C) (Oral)   Ht '5\' 3"'  (1.6 m)   Wt 161 lb 3.2 oz (73.1 kg)   BMI 28.56 kg/m   No Known Allergies  Physical Exam  Constitutional: She  is oriented to person, place, and time. She appears well-developed and well-nourished.  HENT:  Head: Normocephalic and atraumatic.  Right Ear: Tympanic membrane, external ear and ear canal normal.  Left Ear: Tympanic membrane, external ear and ear canal normal.  Nose: Nose normal. No rhinorrhea.  Mouth/Throat: Oropharynx is clear and moist and mucous membranes are normal. No oropharyngeal exudate or posterior oropharyngeal erythema.  Eyes: Conjunctivae and EOM are normal. Pupils are equal, round, and reactive to light.  Neck: Normal range of motion. Neck supple.  Cardiovascular: Normal rate, regular rhythm, normal heart sounds and intact distal pulses.  Pulmonary/Chest: Effort normal and breath sounds normal.  Abdominal: Soft. Bowel sounds are normal.  Neurological: She is alert and oriented to person, place,  and time. She has normal reflexes.  Skin: Skin is warm and dry. No rash noted.  Psychiatric: She has a normal mood and affect. Her behavior is normal. Judgment and thought content normal.  Nursing note and vitals reviewed.   Results for orders placed or performed in visit on 12/12/16  CMP14+EGFR  Result Value Ref Range   Glucose 87 65 - 99 mg/dL   BUN 10 6 - 24 mg/dL   Creatinine, Ser 0.99 0.57 - 1.00 mg/dL   GFR calc non Af Amer 69 >59 mL/min/1.73   GFR calc Af Amer 79 >59 mL/min/1.73   BUN/Creatinine Ratio 10 9 - 23   Sodium 145 (H) 134 - 144 mmol/L   Potassium 3.3 (L) 3.5 - 5.2 mmol/L   Chloride 104 96 - 106 mmol/L   CO2 25 20 - 29 mmol/L   Calcium 8.9 8.7 - 10.2 mg/dL   Total Protein 6.8 6.0 - 8.5 g/dL   Albumin 4.2 3.5 - 5.5 g/dL   Globulin, Total 2.6 1.5 - 4.5 g/dL   Albumin/Globulin Ratio 1.6 1.2 - 2.2   Bilirubin Total <0.2 0.0 - 1.2 mg/dL   Alkaline Phosphatase 70 39 - 117 IU/L   AST 26 0 - 40 IU/L   ALT 18 0 - 32 IU/L  CBC with Differential/Platelet  Result Value Ref Range   WBC 6.0 3.4 - 10.8 x10E3/uL   RBC 3.73 (L) 3.77 - 5.28 x10E6/uL   Hemoglobin 11.6 11.1 - 15.9 g/dL   Hematocrit 34.1 34.0 - 46.6 %   MCV 91 79 - 97 fL   MCH 31.1 26.6 - 33.0 pg   MCHC 34.0 31.5 - 35.7 g/dL   RDW 13.9 12.3 - 15.4 %   Platelets 198 150 - 379 x10E3/uL   Neutrophils 61 Not Estab. %   Lymphs 28 Not Estab. %   Monocytes 9 Not Estab. %   Eos 2 Not Estab. %   Basos 0 Not Estab. %   Neutrophils Absolute 3.6 1.4 - 7.0 x10E3/uL   Lymphocytes Absolute 1.7 0.7 - 3.1 x10E3/uL   Monocytes Absolute 0.5 0.1 - 0.9 x10E3/uL   EOS (ABSOLUTE) 0.1 0.0 - 0.4 x10E3/uL   Basophils Absolute 0.0 0.0 - 0.2 x10E3/uL   Immature Granulocytes 0 Not Estab. %   Immature Grans (Abs) 0.0 0.0 - 0.1 x10E3/uL      Assessment & Plan:   1. Essential hypertension - hydrALAZINE (APRESOLINE) 25 MG tablet; Take 1 tablet (25 mg total) by mouth 3 (three) times daily.  Dispense: 90 tablet; Refill: 11  2.  Shift work sleep disorder - zolpidem (AMBIEN) 5 MG tablet; Take 1 tablet (5 mg total) by mouth at bedtime as needed for sleep.  Dispense: 30 tablet; Refill: 5  3. Autoimmune hepatitis ([place]) - Ambulatory referral to Gastroenterology    Current Outpatient Medications:  .  Azilsartan Medoxomil (EDARBI) 80 MG TABS, Take 1 tablet (80 mg total) by mouth daily., Disp: 90 tablet, Rfl: 3 .  carvedilol (COREG) 25 MG tablet, Take 1 tablet (25 mg total) by mouth 2 (two) times daily., Disp: 180 tablet, Rfl: 3 .  hydrALAZINE (APRESOLINE) 25 MG tablet, Take 1 tablet (25 mg total) by mouth 3 (three) times daily., Disp: 90 tablet, Rfl: 11 .  hydrochlorothiazide (HYDRODIURIL) 25 MG tablet, Take 1 tablet (25 mg total) by mouth daily., Disp: 90 tablet, Rfl: 3 .  zolpidem (AMBIEN) 5 MG tablet, Take 1 tablet (5 mg total) by mouth at bedtime as needed for sleep., Disp: 30 tablet, Rfl: 5 Continue all other maintenance medications as listed above.  Follow up plan: Return in about 6 months (around 01/21/2018) for recheck.  Educational handout given for [place] PA-C [place] [address]  [place], [place] [address] [phone]   07/25/2017, 9:15 AM

## 2017-07-25 NOTE — Telephone Encounter (Signed)
yes

## 2017-07-31 ENCOUNTER — Other Ambulatory Visit: Payer: Self-pay | Admitting: Physician Assistant

## 2017-07-31 ENCOUNTER — Telehealth: Payer: Self-pay | Admitting: Physician Assistant

## 2017-07-31 MED ORDER — SUVOREXANT 20 MG PO TABS: 20.0000 mg | ORAL_TABLET | Freq: Every day | ORAL | 2 refills | Status: DC

## 2017-07-31 NOTE — Telephone Encounter (Signed)
Sent belsomra 20 mg one at bedtime

## 2017-07-31 NOTE — Telephone Encounter (Signed)
Aware. 

## 2017-07-31 NOTE — Telephone Encounter (Signed)
Patient states that she has been trying 2-5mg  ambiens for the last 3 days. She states she is only sleeping about 3 hours

## 2017-07-31 NOTE — Telephone Encounter (Signed)
Increase to 10 mg before we try something else.

## 2017-08-07 ENCOUNTER — Telehealth: Payer: Self-pay | Admitting: Physician Assistant

## 2017-08-08 ENCOUNTER — Telehealth: Payer: Self-pay | Admitting: Physician Assistant

## 2017-08-08 MED ORDER — TEMAZEPAM 15 MG PO CAPS: 15.0000 mg | ORAL_CAPSULE | Freq: Every evening | ORAL | 0 refills | Status: DC | PRN

## 2017-08-08 NOTE — Telephone Encounter (Signed)
Patient aware.

## 2017-08-08 NOTE — Telephone Encounter (Signed)
Please see previous call.

## 2017-08-08 NOTE — Telephone Encounter (Signed)
Medication sent restoril 15 mg at bed

## 2017-08-08 NOTE — Telephone Encounter (Signed)
lmtcb

## 2017-08-15 ENCOUNTER — Ambulatory Visit (INDEPENDENT_AMBULATORY_CARE_PROVIDER_SITE_OTHER): Payer: Managed Care, Other (non HMO) | Admitting: Internal Medicine

## 2017-08-15 ENCOUNTER — Encounter (INDEPENDENT_AMBULATORY_CARE_PROVIDER_SITE_OTHER): Payer: Self-pay | Admitting: Internal Medicine

## 2017-08-15 ENCOUNTER — Encounter (INDEPENDENT_AMBULATORY_CARE_PROVIDER_SITE_OTHER): Payer: Self-pay | Admitting: *Deleted

## 2017-08-15 VITALS — BP 150/90 | HR 60 | Temp 99.1°F | Ht 63.0 in | Wt 159.1 lb

## 2017-08-15 DIAGNOSIS — K754 Autoimmune hepatitis: Secondary | ICD-10-CM | POA: Diagnosis not present

## 2017-08-15 LAB — COMPREHENSIVE METABOLIC PANEL
AG Ratio: 1.5 (calc) (ref 1.0–2.5)
ALT: 16 U/L (ref 6–29)
AST: 20 U/L (ref 10–35)
Albumin: 4.3 g/dL (ref 3.6–5.1)
Alkaline phosphatase (APISO): 39 U/L (ref 33–115)
BUN: 15 mg/dL (ref 7–25)
CO2: 27 mmol/L (ref 20–32)
Calcium: 9.5 mg/dL (ref 8.6–10.2)
Chloride: 104 mmol/L (ref 98–110)
Creat: 1.09 mg/dL (ref 0.50–1.10)
Globulin: 2.9 g/dL (calc) (ref 1.9–3.7)
Glucose, Bld: 90 mg/dL (ref 65–139)
Potassium: 3.4 mmol/L — ABNORMAL LOW (ref 3.5–5.3)
Sodium: 141 mmol/L (ref 135–146)
Total Bilirubin: 0.5 mg/dL (ref 0.2–1.2)
Total Protein: 7.2 g/dL (ref 6.1–8.1)

## 2017-08-15 LAB — CBC WITH DIFFERENTIAL/PLATELET
Basophils Absolute: 19 cells/uL (ref 0–200)
Basophils Relative: 0.3 %
Eosinophils Absolute: 118 cells/uL (ref 15–500)
Eosinophils Relative: 1.9 %
HCT: 37.2 % (ref 35.0–45.0)
Hemoglobin: 12.5 g/dL (ref 11.7–15.5)
Lymphs Abs: 1370 cells/uL (ref 850–3900)
MCH: 29.2 pg (ref 27.0–33.0)
MCHC: 33.6 g/dL (ref 32.0–36.0)
MCV: 86.9 fL (ref 80.0–100.0)
MPV: 10.2 fL (ref 7.5–12.5)
Monocytes Relative: 9 %
Neutro Abs: 4135 cells/uL (ref 1500–7800)
Neutrophils Relative %: 66.7 %
Platelets: 226 10*3/uL (ref 140–400)
RBC: 4.28 10*6/uL (ref 3.80–5.10)
RDW: 13.9 % (ref 11.0–15.0)
Total Lymphocyte: 22.1 %
WBC mixed population: 558 cells/uL (ref 200–950)
WBC: 6.2 10*3/uL (ref 3.8–10.8)

## 2017-08-15 LAB — SEDIMENTATION RATE: Sed Rate: 25 mm/h — ABNORMAL HIGH (ref 0–20)

## 2017-08-15 NOTE — Progress Notes (Signed)
Subjective:    Patient ID: Karen Robinson, female    DOB: 06-01-1971, 47 y.o.   MRN: [id number]  HPI Referred by Prudy FeelerAngel Jones PA-C for autoimmune hepatitis. Previous had [facility] Argentina DonovanElizabeth Goacher [facility]. She says she was diagnosed by Dr. Karilyn Cotaehman in 2009.  She says she was jaundiced when she was diagnosed Has not been followed by GI in over 4 yrs.  She is not maintained on any medications at this time. In the past, maintained on Azathioprine. She is doing good. No abdominal pain.  Appetite is good.  Previously followed by [facility] Janett [facility]ellaloruntoba [facility].     Liver biopsy in 2015 showed liver with 35% macrovesicular steatosis. No evidence of serohepatitis. No significant inflammatory activity is seen. No significant fibrosis.    Liver biopsy in 2010 revealed marked fibrosis concerning for cirrhosis. No significant steatosis or cholestasis.   In 2009 liver biopsy revealed massive hepatocellular necrosis. 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 The hepatocellular destruction is extensive and determining the etiology at this point is difficult. Differential diagnosis is broad and includes autoimmune hepatitis, viral hepatitis, the effect of drugs/medications, herbal remedies, infectious etiologies and Wilson disease, Alpha 1 antitrypsin  Acute Hepatitis panel negative 01/07/2014  Works at [employer] in [place].    CMP Latest Ref Rng & Units 12/12/2016  Glucose 65 - 99 mg/dL 87  BUN 6 - 24 mg/dL 10  Creatinine 7.820.57 - 9.561.00 mg/dL 2.130.99  Sodium 086134 - 578144 mmol/L 145(H)  Potassium 3.5 - 5.2 mmol/L 3.3(L)  Chloride 96 - 106 mmol/L 104  CO2 20 - 29 mmol/L 25  Calcium 8.7 - 10.2 mg/dL 8.9  Total Protein 6.0 - 8.5 g/dL 6.8  Total Bilirubin 0.0 - 1.2 mg/dL <4.6<0.2  Alkaline Phos 39 - 117 IU/L 70  AST 0 - 40 IU/L 26  ALT 0 - 32 IU/L 18   CBC    Component Value Date/Time   WBC 6.0 12/12/2016 1221   WBC 10.9 (H) 06/27/2007 1203   RBC 3.73 (L) 12/12/2016 1221   RBC 4.96 06/27/2007 1203   HGB 11.6  12/12/2016 1221   HCT 34.1 12/12/2016 1221   PLT 198 12/12/2016 1221   MCV 91 12/12/2016 1221   MCH 31.1 12/12/2016 1221   MCHC 34.0 12/12/2016 1221   MCHC 35.2 06/27/2007 1203   RDW 13.9 12/12/2016 1221   LYMPHSABS 1.7 12/12/2016 1221   EOSABS 0.1 12/12/2016 1221   BASOSABS 0.0 12/12/2016 1221       Review of Systems Past Medical History:  Diagnosis Date  . Autoimmune hepatitis ([facility])   . Hypertension     Past Surgical History:  Procedure Laterality Date  . CESAREAN SECTION    . CHOLECYSTECTOMY      No Known Allergies  Current Outpatient Medications on File Prior to Visit  Medication Sig Dispense Refill  . Azilsartan Medoxomil (EDARBI) 80 MG TABS Take 1 tablet (80 mg total) by mouth daily. 90 tablet 3  . carvedilol (COREG) 25 MG tablet Take 1 tablet (25 mg total) by mouth 2 (two) times daily. 180 tablet 3  . hydrALAZINE (APRESOLINE) 25 MG tablet Take 1 tablet (25 mg total) by mouth 3 (three) times daily. 90 tablet 11  . hydrochlorothiazide (HYDRODIURIL) 25 MG tablet Take 1 tablet (25 mg total) by mouth daily. 90 tablet 3  . zolpidem (AMBIEN) 5 MG tablet Take 5 mg by mouth at bedtime as needed for sleep.     No current facility-administered medications on file prior  to visit.         Objective:   Physical Exam Blood pressure (!) 150/90, pulse 60, temperature 99.1 F (37.3 C), height 5\' 3"  (1.6 m), weight 159 lb 1.6 oz (72.2 kg).  Alert and oriented. Skin warm and dry. Oral mucosa is moist.   . Sclera anicteric, conjunctivae is pink. Thyroid not enlarged. No cervical lymphadenopathy. Lungs clear. Heart regular rate and rhythm.  Abdomen is soft. Bowel sounds are positive. No hepatomegaly. No abdominal masses felt. No tenderness.  No edema to lower extremities.  .        Assessment & Plan:  Autoimmune Hepatitis. Am going to get a CBC, CMET, sedrate and US abdomen. OV in 6 months. Will get records from [facility].

## 2017-08-18 ENCOUNTER — Ambulatory Visit (HOSPITAL_COMMUNITY): Payer: Managed Care, Other (non HMO)

## 2017-09-02 ENCOUNTER — Other Ambulatory Visit: Payer: Self-pay | Admitting: Physician Assistant

## 2017-09-04 ENCOUNTER — Telehealth: Payer: Self-pay | Admitting: Physician Assistant

## 2017-09-04 ENCOUNTER — Other Ambulatory Visit: Payer: Self-pay | Admitting: Physician Assistant

## 2017-09-04 MED ORDER — VALSARTAN 320 MG PO TABS: 320.0000 mg | ORAL_TABLET | Freq: Every day | ORAL | 2 refills | Status: DC

## 2017-09-04 NOTE — Telephone Encounter (Signed)
Informed patient that this office does not have samples of Edarbi.  Patient would like to know if this medication can be changed to something different

## 2017-09-04 NOTE — Addendum Note (Signed)
Addended by: Caryl BisBOWMAN, Reola Buckles M on: 09/04/2017 12:09 PM   Modules accepted: Orders

## 2017-09-04 NOTE — Telephone Encounter (Signed)
Aware of recommendations  

## 2017-09-04 NOTE — Telephone Encounter (Signed)
Please stop the [place].  Start generic valsartan 320 mg 1 daily.  [facility]  Please come and recheck blood pressure in the next 1-2 months.

## 2017-09-06 ENCOUNTER — Telehealth (INDEPENDENT_AMBULATORY_CARE_PROVIDER_SITE_OTHER): Payer: Self-pay | Admitting: *Deleted

## 2017-09-06 NOTE — Telephone Encounter (Signed)
Patient called requesting her lab results. Please advise [phone]

## 2017-09-06 NOTE — Telephone Encounter (Signed)
Message left on answering machine 

## 2017-09-06 NOTE — Telephone Encounter (Signed)
Results left on phone.

## 2017-12-04 ENCOUNTER — Other Ambulatory Visit: Payer: Self-pay | Admitting: Physician Assistant

## 2018-01-02 ENCOUNTER — Encounter: Payer: Self-pay | Admitting: Physician Assistant

## 2018-01-02 ENCOUNTER — Ambulatory Visit: Payer: BLUE CROSS/BLUE SHIELD | Admitting: Physician Assistant

## 2018-01-02 DIAGNOSIS — I10 Essential (primary) hypertension: Secondary | ICD-10-CM | POA: Diagnosis not present

## 2018-01-02 MED ORDER — AMOXICILLIN 500 MG PO CAPS: 500.0000 mg | ORAL_CAPSULE | Freq: Three times a day (TID) | ORAL | 0 refills | Status: DC

## 2018-01-02 MED ORDER — HYDRALAZINE HCL 50 MG PO TABS: 50.0000 mg | ORAL_TABLET | Freq: Three times a day (TID) | ORAL | 0 refills | Status: DC

## 2018-01-02 MED ORDER — CLOBETASOL PROPIONATE 0.05 % EX OINT: 1.0000 "application " | TOPICAL_OINTMENT | Freq: Two times a day (BID) | CUTANEOUS | 0 refills | Status: DC

## 2018-01-03 ENCOUNTER — Telehealth: Payer: Self-pay | Admitting: Physician Assistant

## 2018-01-03 MED ORDER — VALSARTAN 320 MG PO TABS: 320.0000 mg | ORAL_TABLET | Freq: Every day | ORAL | 2 refills | Status: DC

## 2018-01-03 NOTE — Telephone Encounter (Signed)
Rx sent to pharmacy for patient. Patient aware. 

## 2018-01-05 ENCOUNTER — Telehealth: Payer: Self-pay | Admitting: Physician Assistant

## 2018-01-05 MED ORDER — SPIRONOLACTONE 25 MG PO TABS: 25.0000 mg | ORAL_TABLET | Freq: Every day | ORAL | 3 refills | Status: DC

## 2018-01-05 NOTE — Progress Notes (Signed)
.   BP (!) 185/99   Pulse 66   Temp 97.9 F (36.6 C) (Oral)   Ht 5\' 3"  (1.6 m)   Wt 163 lb (73.9 kg)   BMI 28.87 kg/m    Subjective:    Patient ID: Karen Robinson, female    DOB: [birth year], 47 y.o.   MRN: [id number]  HPI: Karen Robinson is a 47 y.o. female presenting on 01/02/2018 for Hypertension and Ear Pain (left ) This patient comes in for periodic recheck on medications and conditions including uncontrolled hypertension, probable secondary cause. No medication is controlling it. Discuss with another provider and COHN's syndrome as possibility. Has chronic low and low normal potassium..   All medications are reviewed today. There are no reports of any problems with the medications. All of the medical conditions are reviewed and updated.  Lab work is reviewed and will be ordered as medically necessary. There are no new problems reported with today's visit.    Past Medical History:  Diagnosis Date  . Autoimmune hepatitis ([facility])   . Hypertension    Relevant past medical, surgical, family and social history reviewed and updated as indicated. Interim medical history since our last visit reviewed. Allergies and medications reviewed and updated. DATA REVIEWED: CHART IN EPIC  Family History reviewed for pertinent findings.  Review of Systems  Constitutional: Negative.  Negative for activity change, fatigue and fever.  HENT: Negative.   Eyes: Negative.   Respiratory: Negative.  Negative for cough, shortness of breath and wheezing.   Cardiovascular: Negative.  Negative for chest pain.  Gastrointestinal: Negative.  Negative for abdominal pain.  Endocrine: Negative.   Genitourinary: Negative.  Negative for dysuria.  Musculoskeletal: Negative.   Skin: Negative.   Neurological: Negative.     Allergies as of 01/02/2018   No Known Allergies     Medication List        Accurate as of 01/02/18 11:59 PM. Always use your most recent med list.          amoxicillin 500 MG  capsule Commonly known as:  AMOXIL Take 1 capsule (500 mg total) by mouth 3 (three) times daily.   carvedilol 25 MG tablet Commonly known as:  COREG Take 1 tablet (25 mg total) by mouth 2 (two) times daily.   clobetasol ointment 0.05 % Commonly known as:  TEMOVATE Apply 1 application topically 2 (two) times daily.   hydrALAZINE 50 MG tablet Commonly known as:  APRESOLINE Take 1 tablet (50 mg total) by mouth 3 (three) times daily.   hydrochlorothiazide 25 MG tablet Commonly known as:  HYDRODIURIL Take 1 tablet (25 mg total) by mouth daily.   valsartan 320 MG tablet Commonly known as:  DIOVAN TAKE 1 TABLET BY MOUTH ONCE DAILY          Objective:    BP (!) 185/99   Pulse 66   Temp 97.9 F (36.6 C) (Oral)   Ht 5\' 3"  (1.6 m)   Wt 163 lb (73.9 kg)   BMI 28.87 kg/m   No Known Allergies  Wt Readings from Last 3 Encounters:  01/02/18 163 lb (73.9 kg)  08/15/17 159 lb 1.6 oz (72.2 kg)  07/24/17 161 lb 3.2 oz (73.1 kg)    Physical Exam  Constitutional: She is oriented to person, place, and time. She appears well-developed and well-nourished.  HENT:  Head: Normocephalic and atraumatic.  Eyes: Pupils are equal, round, and reactive to light. Conjunctivae and EOM are normal.  Cardiovascular: Normal rate,  regular rhythm, normal heart sounds and intact distal pulses.  Pulmonary/Chest: Effort normal and breath sounds normal.  Abdominal: Soft. Bowel sounds are normal.  Neurological: She is alert and oriented to person, place, and time. She has normal reflexes.  Skin: Skin is warm and dry. No rash noted.  Psychiatric: She has a normal mood and affect. Her behavior is normal. Judgment and thought content normal.        Assessment & Plan:   1. Essential hypertension - hydrALAZINE (APRESOLINE) 50 MG tablet; Take 1 tablet (50 mg total) by mouth 3 (three) times daily.  Dispense: 90 tablet; Refill: 0 CONSIDER hypoaldosteronism CONSIDER spironolactone  Continue all other  maintenance medications as listed above.  Follow up plan: Return in about 1 month (around 01/30/2018) for recheck.  Educational handout given for survey  Remus LofflerAngel S. Sophiana Milanese PA-C [facility] [address]  [place], [place] [id number] [phone]   01/05/2018, 7:53 AM

## 2018-01-05 NOTE — Telephone Encounter (Signed)
Patient aware.

## 2018-01-10 ENCOUNTER — Other Ambulatory Visit: Payer: Self-pay | Admitting: Physician Assistant

## 2018-01-31 ENCOUNTER — Telehealth: Payer: Self-pay | Admitting: Physician Assistant

## 2018-01-31 ENCOUNTER — Other Ambulatory Visit: Payer: Self-pay | Admitting: Physician Assistant

## 2018-01-31 NOTE — Telephone Encounter (Signed)
Patient aware of results and verbalizes understanding.  

## 2018-01-31 NOTE — Telephone Encounter (Signed)
Med list updated

## 2018-01-31 NOTE — Telephone Encounter (Signed)
Patient states that her BP has been running low since starting the new bp medication 106/70, 97/69, 101/77, 93/72, 98/70. Please advise

## 2018-01-31 NOTE — Telephone Encounter (Signed)
Stop her hydralazine at this time, needs a follow up in the next few weeks and labs. Continue carvedilol, valsartan and spironolactone for now. The HCTZ should have been stopped when the spironolactone is added.

## 2018-02-15 ENCOUNTER — Encounter: Payer: Self-pay | Admitting: Physician Assistant

## 2018-02-15 ENCOUNTER — Ambulatory Visit: Payer: BLUE CROSS/BLUE SHIELD | Admitting: Physician Assistant

## 2018-02-15 VITALS — BP 133/82 | HR 65 | Temp 97.8°F | Ht 63.0 in | Wt 156.0 lb

## 2018-02-15 DIAGNOSIS — E876 Hypokalemia: Secondary | ICD-10-CM

## 2018-02-15 DIAGNOSIS — I10 Essential (primary) hypertension: Secondary | ICD-10-CM | POA: Diagnosis not present

## 2018-02-15 DIAGNOSIS — R6889 Other general symptoms and signs: Secondary | ICD-10-CM | POA: Diagnosis not present

## 2018-02-15 MED ORDER — SPIRONOLACTONE 50 MG PO TABS: 25.0000 mg | ORAL_TABLET | Freq: Every day | ORAL | 1 refills | Status: DC

## 2018-02-15 MED ORDER — HYDRALAZINE HCL 50 MG PO TABS: 50.0000 mg | ORAL_TABLET | Freq: Three times a day (TID) | ORAL | 3 refills | Status: DC

## 2018-02-16 LAB — CMP14+EGFR
ALT: 22 IU/L (ref 0–32)
AST: 23 IU/L (ref 0–40)
Albumin/Globulin Ratio: 1.3 (ref 1.2–2.2)
Albumin: 4.4 g/dL (ref 3.5–5.5)
Alkaline Phosphatase: 53 IU/L (ref 39–117)
BUN/Creatinine Ratio: 15 (ref 9–23)
BUN: 18 mg/dL (ref 6–24)
Bilirubin Total: 0.4 mg/dL (ref 0.0–1.2)
CO2: 23 mmol/L (ref 20–29)
Calcium: 9.6 mg/dL (ref 8.7–10.2)
Chloride: 99 mmol/L (ref 96–106)
Creatinine, Ser: 1.24 mg/dL — ABNORMAL HIGH (ref 0.57–1.00)
GFR calc Af Amer: 59 mL/min/{1.73_m2} — ABNORMAL LOW (ref >59)
GFR calc non Af Amer: 51 mL/min/{1.73_m2} — ABNORMAL LOW (ref >59)
Globulin, Total: 3.3 g/dL (ref 1.5–4.5)
Glucose: 85 mg/dL (ref 65–99)
Potassium: 3.9 mmol/L (ref 3.5–5.2)
Sodium: 139 mmol/L (ref 134–144)
Total Protein: 7.7 g/dL (ref 6.0–8.5)

## 2018-02-16 LAB — CBC WITH DIFFERENTIAL/PLATELET
Basophils Absolute: 0 10*3/uL (ref 0.0–0.2)
Basos: 0 %
EOS (ABSOLUTE): 0.1 10*3/uL (ref 0.0–0.4)
Eos: 2 %
Hematocrit: 37.7 % (ref 34.0–46.6)
Hemoglobin: 12.1 g/dL (ref 11.1–15.9)
Immature Grans (Abs): 0 10*3/uL (ref 0.0–0.1)
Immature Granulocytes: 0 %
Lymphocytes Absolute: 1.7 10*3/uL (ref 0.7–3.1)
Lymphs: 25 %
MCH: 28.6 pg (ref 26.6–33.0)
MCHC: 32.1 g/dL (ref 31.5–35.7)
MCV: 89 fL (ref 79–97)
Monocytes Absolute: 0.6 10*3/uL (ref 0.1–0.9)
Monocytes: 8 %
Neutrophils Absolute: 4.3 10*3/uL (ref 1.4–7.0)
Neutrophils: 65 %
Platelets: 273 10*3/uL (ref 150–450)
RBC: 4.23 x10E6/uL (ref 3.77–5.28)
RDW: 15 % (ref 12.3–15.4)
WBC: 6.7 10*3/uL (ref 3.4–10.8)

## 2018-02-16 LAB — THYROID PANEL WITH TSH
Free Thyroxine Index: 2.7 (ref 1.2–4.9)
T3 Uptake Ratio: 37 % (ref 24–39)
T4, Total: 7.3 ug/dL (ref 4.5–12.0)
TSH: 2.12 u[IU]/mL (ref 0.450–4.500)

## 2018-02-19 DIAGNOSIS — E876 Hypokalemia: Secondary | ICD-10-CM | POA: Insufficient documentation

## 2018-02-19 NOTE — Progress Notes (Signed)
BP 133/82   Pulse 65   Temp 97.8 F (36.6 C) (Oral)   Ht '5\' 3"'  (1.6 m)   Wt 156 lb (70.8 kg)   BMI 27.63 kg/m    Subjective:    Patient ID: Karen Robinson, female    DOB: [birth year], 47 y.o.   MRN: [id number]  HPI: Karen Robinson is a 47 y.o. female presenting on 02/15/2018 for Hypertension  This patient comes in for recheck on her hypertension.  We have had a change and started Spironolactone.  Related to recheck her kidney functions and potassium again.  She states that she has had some very good readings at home.  And actually had to stop one medicine but then she had restarted as the blood pressure started to creep back up.  She had actually restarted her other diuretic.  And I told her to get rid of this entirely and that we would be staying with the Spironolactone.  She has had a great improvement in her blood pressure.  Past Medical History:  Diagnosis Date  . Autoimmune hepatitis ([place])   . Hypertension    Relevant past medical, surgical, family and social history reviewed and updated as indicated. Interim medical history since our last visit reviewed. Allergies and medications reviewed and updated. DATA REVIEWED: CHART IN EPIC  Family History reviewed for pertinent findings.  Review of Systems  Constitutional: Negative.   HENT: Negative.   Eyes: Negative.   Respiratory: Negative.   Gastrointestinal: Negative.   Genitourinary: Negative.     Allergies as of 02/15/2018   No Known Allergies     Medication List        Accurate as of 02/15/18 11:59 PM. Always use your most recent med list.          carvedilol 25 MG tablet Commonly known as:  COREG TAKE 1 TABLET BY MOUTH TWICE DAILY   clobetasol ointment 0.05 % Commonly known as:  TEMOVATE Apply 1 application topically 2 (two) times daily.   hydrALAZINE 50 MG tablet Commonly known as:  APRESOLINE Take 1 tablet (50 mg total) by mouth 3 (three) times daily.   spironolactone 50 MG tablet Commonly  known as:  ALDACTONE Take 0.5 tablets (25 mg total) by mouth daily.   valsartan 320 MG tablet Commonly known as:  DIOVAN Take 1 tablet (320 mg total) by mouth daily.          Objective:    BP 133/82   Pulse 65   Temp 97.8 F (36.6 C) (Oral)   Ht '5\' 3"'  (1.6 m)   Wt 156 lb (70.8 kg)   BMI 27.63 kg/m   No Known Allergies  Wt Readings from Last 3 Encounters:  02/15/18 156 lb (70.8 kg)  01/02/18 163 lb (73.9 kg)  08/15/17 159 lb 1.6 oz (72.2 kg)    Physical Exam  Constitutional: She is oriented to person, place, and time. She appears well-developed and well-nourished.  HENT:  Head: Normocephalic and atraumatic.  Eyes: Pupils are equal, round, and reactive to light. Conjunctivae and EOM are normal.  Cardiovascular: Normal rate, regular rhythm, normal heart sounds and intact distal pulses.  Pulmonary/Chest: Effort normal and breath sounds normal.  Abdominal: Soft. Bowel sounds are normal.  Neurological: She is alert and oriented to person, place, and time. She has normal reflexes.  Skin: Skin is warm and dry. No rash noted.  Psychiatric: She has a normal mood and affect. Her behavior is normal. Judgment and thought content normal.  Results for orders placed or performed in visit on 02/15/18  CBC with Differential/Platelet  Result Value Ref Range   WBC 6.7 3.4 - 10.8 x10E3/uL   RBC 4.23 3.77 - 5.28 x10E6/uL   Hemoglobin 12.1 11.1 - 15.9 g/dL   Hematocrit 37.7 34.0 - 46.6 %   MCV 89 79 - 97 fL   MCH 28.6 26.6 - 33.0 pg   MCHC 32.1 31.5 - 35.7 g/dL   RDW 15.0 12.3 - 15.4 %   Platelets 273 150 - 450 x10E3/uL   Neutrophils 65 Not Estab. %   Lymphs 25 Not Estab. %   Monocytes 8 Not Estab. %   Eos 2 Not Estab. %   Basos 0 Not Estab. %   Neutrophils Absolute 4.3 1.4 - 7.0 x10E3/uL   Lymphocytes Absolute 1.7 0.7 - 3.1 x10E3/uL   Monocytes Absolute 0.6 0.1 - 0.9 x10E3/uL   EOS (ABSOLUTE) 0.1 0.0 - 0.4 x10E3/uL   Basophils Absolute 0.0 0.0 - 0.2 x10E3/uL   Immature  Granulocytes 0 Not Estab. %   Immature Grans (Abs) 0.0 0.0 - 0.1 x10E3/uL  CMP14+EGFR  Result Value Ref Range   Glucose 85 65 - 99 mg/dL   BUN 18 6 - 24 mg/dL   Creatinine, Ser 1.24 (H) 0.57 - 1.00 mg/dL   GFR calc non Af Amer 51 (L) >59 mL/min/1.73   GFR calc Af Amer 59 (L) >59 mL/min/1.73   BUN/Creatinine Ratio 15 9 - 23   Sodium 139 134 - 144 mmol/L   Potassium 3.9 3.5 - 5.2 mmol/L   Chloride 99 96 - 106 mmol/L   CO2 23 20 - 29 mmol/L   Calcium 9.6 8.7 - 10.2 mg/dL   Total Protein 7.7 6.0 - 8.5 g/dL   Albumin 4.4 3.5 - 5.5 g/dL   Globulin, Total 3.3 1.5 - 4.5 g/dL   Albumin/Globulin Ratio 1.3 1.2 - 2.2   Bilirubin Total 0.4 0.0 - 1.2 mg/dL   Alkaline Phosphatase 53 39 - 117 IU/L   AST 23 0 - 40 IU/L   ALT 22 0 - 32 IU/L  Thyroid Panel With TSH  Result Value Ref Range   TSH 2.120 0.450 - 4.500 uIU/mL   T4, Total 7.3 4.5 - 12.0 ug/dL   T3 Uptake Ratio 37 24 - 39 %   Free Thyroxine Index 2.7 1.2 - 4.9      Assessment & Plan:   1. Essential hypertension - CBC with Differential/Platelet - CMP14+EGFR - hydrALAZINE (APRESOLINE) 50 MG tablet; Take 1 tablet (50 mg total) by mouth 3 (three) times daily.  Dispense: 270 tablet; Refill: 3 - spironolactone (ALDACTONE) 50 MG tablet; Take 0.5 tablets (25 mg total) by mouth daily.  Dispense: 90 tablet; Refill: 1 - Thyroid Panel With TSH  2. Hypokalemia - CBC with Differential/Platelet - CMP14+EGFR - spironolactone (ALDACTONE) 50 MG tablet; Take 0.5 tablets (25 mg total) by mouth daily.  Dispense: 90 tablet; Refill: 1 - Thyroid Panel With TSH  3. Cold intolerance - Thyroid Panel With TSH   Continue all other maintenance medications as listed above.  Follow up plan: Return in about 6 weeks (around 03/29/2018).  Educational handout given for [place] PA-C [place] [address]  [place], [place] [phone]   02/19/2018, 10:34 PM

## 2018-02-21 ENCOUNTER — Other Ambulatory Visit: Payer: Self-pay | Admitting: Physician Assistant

## 2018-02-21 ENCOUNTER — Telehealth: Payer: Self-pay | Admitting: Physician Assistant

## 2018-02-21 DIAGNOSIS — I10 Essential (primary) hypertension: Secondary | ICD-10-CM

## 2018-02-21 DIAGNOSIS — E876 Hypokalemia: Secondary | ICD-10-CM

## 2018-02-21 MED ORDER — SPIRONOLACTONE 50 MG PO TABS: 50.0000 mg | ORAL_TABLET | Freq: Every day | ORAL | 1 refills | Status: DC

## 2018-02-21 NOTE — Telephone Encounter (Signed)
Patient aware.

## 2018-02-21 NOTE — Telephone Encounter (Signed)
Whole pill, I will go and change the dosing number, I did not change in on the script

## 2018-04-02 ENCOUNTER — Other Ambulatory Visit: Payer: Self-pay | Admitting: Physician Assistant

## 2018-04-03 ENCOUNTER — Ambulatory Visit: Payer: BLUE CROSS/BLUE SHIELD | Admitting: Physician Assistant

## 2018-06-24 ENCOUNTER — Other Ambulatory Visit: Payer: Self-pay | Admitting: Physician Assistant

## 2018-06-29 ENCOUNTER — Other Ambulatory Visit: Payer: Self-pay | Admitting: *Deleted

## 2018-06-29 MED ORDER — VALSARTAN 320 MG PO TABS: 320.0000 mg | ORAL_TABLET | Freq: Every day | ORAL | 0 refills | Status: DC

## 2018-07-05 ENCOUNTER — Encounter: Payer: Self-pay | Admitting: Physician Assistant

## 2018-07-06 NOTE — Telephone Encounter (Signed)
PT has called states she couldn't figure out how to respond on Mychart but that she does want to come in and have labs and does she need to be fasting??   Please call pt at [phone]

## 2018-07-25 ENCOUNTER — Telehealth: Payer: Self-pay | Admitting: Physician Assistant

## 2018-07-25 NOTE — Telephone Encounter (Signed)
Patient advise to sit still for ten minutes then take her blood pressure.   Take at least three readings daily and call back to give to provider for review.

## 2018-07-30 ENCOUNTER — Other Ambulatory Visit: Payer: Self-pay | Admitting: Physician Assistant

## 2018-07-30 NOTE — Telephone Encounter (Signed)
Patient was told to call back and give BP readings :  07/25/18 Pm 101/67   07/26/18 am 108/64   Mid day 82/51 and 79/51 and 90/57   Pm 96/65   07/27/18 Am 109/69   Mid day 91/69    Pm 106/67  Patient states she only takes one of the Carvediol. Patient states she is very fatigued. Patient states she works out four times a week. Patient also check at [facility] and it runs low with their machine as well.

## 2018-07-30 NOTE — Telephone Encounter (Signed)
Stop hydralazine if still taking If still lower than 110/70, hold all carvedilol.  Call back with readings.

## 2018-07-30 NOTE — Telephone Encounter (Signed)
Patient aware and verbalizes understanding. States she has not been taking hydralazine and will just stop taking Carvedilol all together since she can not always take her bp before she takes her medicine. States she will just stop taking it and keep a check on her bp and let us know some readings.

## 2018-07-30 NOTE — Telephone Encounter (Signed)
If it continues to be low, can take 1/2 tab of her valsartan

## 2018-08-01 ENCOUNTER — Other Ambulatory Visit: Payer: Self-pay

## 2018-08-01 NOTE — Telephone Encounter (Signed)
Patient aware and will continue monitoring blood pressure.

## 2018-09-14 ENCOUNTER — Telehealth: Payer: Self-pay | Admitting: Physician Assistant

## 2018-09-14 ENCOUNTER — Other Ambulatory Visit: Payer: Self-pay | Admitting: Physician Assistant

## 2018-09-14 DIAGNOSIS — E876 Hypokalemia: Secondary | ICD-10-CM

## 2018-09-14 DIAGNOSIS — I10 Essential (primary) hypertension: Secondary | ICD-10-CM

## 2018-09-14 MED ORDER — CARVEDILOL 25 MG PO TABS: 25.0000 mg | ORAL_TABLET | Freq: Two times a day (BID) | ORAL | 3 refills | Status: DC

## 2018-09-14 MED ORDER — SPIRONOLACTONE 50 MG PO TABS: 50.0000 mg | ORAL_TABLET | Freq: Every day | ORAL | 0 refills | Status: DC

## 2018-09-14 MED ORDER — VALSARTAN 320 MG PO TABS: 320.0000 mg | ORAL_TABLET | Freq: Every day | ORAL | 0 refills | Status: DC

## 2018-09-14 NOTE — Telephone Encounter (Signed)
What is the name of the medication? Valsartan 320 mg, Spironolactone 50 mg, Carvedilol 25 mg Don't want to come in for med refills with what is going on  Have you contacted your pharmacy to request a refill? NO  Which pharmacy would you like this sent to? [facility] in [place]   Patient notified that their request is being sent to the clinical staff for review and that they should receive a call once it is complete. If they do not receive a call within 24 hours they can check with their pharmacy or our office.

## 2018-09-14 NOTE — Telephone Encounter (Signed)
Sent the coreg

## 2018-09-14 NOTE — Telephone Encounter (Signed)
Pt aware Coreg sent to pharmacy

## 2018-09-14 NOTE — Telephone Encounter (Signed)
Carvedilol 25 mg not on current med list Pt's BP started creeping back up so she starting taking again as prescribed, it has been running normal Please advise on refill

## 2018-09-28 ENCOUNTER — Other Ambulatory Visit: Payer: Self-pay | Admitting: Physician Assistant

## 2018-09-28 ENCOUNTER — Telehealth: Payer: Self-pay | Admitting: *Deleted

## 2018-09-28 MED ORDER — LOSARTAN POTASSIUM 100 MG PO TABS: 100.0000 mg | ORAL_TABLET | Freq: Every day | ORAL | 1 refills | Status: DC

## 2018-09-28 NOTE — Telephone Encounter (Signed)
Fax from [facility] Valsartan 320 mg on backorder Request for alternative - Losartan or other alternative Please advise

## 2018-09-28 NOTE — Telephone Encounter (Signed)
Sent losartan

## 2018-12-14 ENCOUNTER — Other Ambulatory Visit: Payer: Self-pay | Admitting: Physician Assistant

## 2018-12-17 ENCOUNTER — Telehealth: Payer: Self-pay | Admitting: *Deleted

## 2018-12-17 ENCOUNTER — Other Ambulatory Visit: Payer: Self-pay | Admitting: Physician Assistant

## 2018-12-17 MED ORDER — VALSARTAN 320 MG PO TABS: 320.0000 mg | ORAL_TABLET | Freq: Every day | ORAL | 1 refills | Status: DC

## 2018-12-17 NOTE — Telephone Encounter (Signed)
Yes it is okay to continue the valsartan.

## 2018-12-17 NOTE — Telephone Encounter (Signed)
TC from [facility] RF request for Valsartan on 12/14/18, this had been denied since it was changed to losartan d/t backorder.  [facility] states they do have the Valsartan in stock if provider would like to keep patient on the Valsartan  Please advise

## 2018-12-17 NOTE — Telephone Encounter (Signed)
Please send in Rx

## 2019-01-24 ENCOUNTER — Other Ambulatory Visit: Payer: Self-pay | Admitting: Physician Assistant

## 2019-01-24 DIAGNOSIS — E876 Hypokalemia: Secondary | ICD-10-CM

## 2019-01-24 DIAGNOSIS — I10 Essential (primary) hypertension: Secondary | ICD-10-CM

## 2019-02-22 ENCOUNTER — Other Ambulatory Visit: Payer: Self-pay

## 2019-02-22 ENCOUNTER — Telehealth: Payer: Self-pay | Admitting: Physician Assistant

## 2019-02-25 ENCOUNTER — Encounter: Payer: Self-pay | Admitting: Physician Assistant

## 2019-02-25 ENCOUNTER — Ambulatory Visit: Payer: BLUE CROSS/BLUE SHIELD | Admitting: Physician Assistant

## 2019-02-25 VITALS — BP 117/73 | HR 63 | Temp 97.8°F | Ht 63.0 in | Wt 176.0 lb

## 2019-02-25 DIAGNOSIS — E876 Hypokalemia: Secondary | ICD-10-CM

## 2019-02-25 DIAGNOSIS — I10 Essential (primary) hypertension: Secondary | ICD-10-CM | POA: Diagnosis not present

## 2019-02-25 DIAGNOSIS — K754 Autoimmune hepatitis: Secondary | ICD-10-CM

## 2019-02-25 DIAGNOSIS — M2550 Pain in unspecified joint: Secondary | ICD-10-CM

## 2019-02-25 DIAGNOSIS — Z0000 Encounter for general adult medical examination without abnormal findings: Secondary | ICD-10-CM

## 2019-02-25 DIAGNOSIS — Z1211 Encounter for screening for malignant neoplasm of colon: Secondary | ICD-10-CM

## 2019-02-25 LAB — LIPID PANEL
Chol/HDL Ratio: 5.1 ratio — ABNORMAL HIGH (ref 0.0–4.4)
Cholesterol, Total: 163 mg/dL (ref 100–199)
HDL: 32 mg/dL — ABNORMAL LOW (ref >39)
LDL Chol Calc (NIH): 113 mg/dL — ABNORMAL HIGH (ref 0–99)
Triglycerides: 97 mg/dL (ref 0–149)
VLDL Cholesterol Cal: 18 mg/dL (ref 5–40)

## 2019-02-25 MED ORDER — VALSARTAN 320 MG PO TABS: 320.0000 mg | ORAL_TABLET | Freq: Every day | ORAL | 3 refills | Status: DC

## 2019-02-25 MED ORDER — SPIRONOLACTONE 50 MG PO TABS: 50.0000 mg | ORAL_TABLET | Freq: Every day | ORAL | 3 refills | Status: DC

## 2019-02-25 MED ORDER — CARVEDILOL 12.5 MG PO TABS: 12.5000 mg | ORAL_TABLET | Freq: Two times a day (BID) | ORAL | 0 refills | Status: DC

## 2019-02-27 ENCOUNTER — Telehealth: Payer: Self-pay | Admitting: Physician Assistant

## 2019-02-27 ENCOUNTER — Other Ambulatory Visit: Payer: Self-pay | Admitting: Physician Assistant

## 2019-02-27 DIAGNOSIS — R700 Elevated erythrocyte sedimentation rate: Secondary | ICD-10-CM

## 2019-02-27 DIAGNOSIS — N289 Disorder of kidney and ureter, unspecified: Secondary | ICD-10-CM

## 2019-02-27 DIAGNOSIS — E876 Hypokalemia: Secondary | ICD-10-CM

## 2019-02-27 LAB — CMP14+EGFR
ALT: 32 IU/L (ref 0–32)
AST: 27 IU/L (ref 0–40)
Albumin/Globulin Ratio: 1.4 (ref 1.2–2.2)
Albumin: 4.4 g/dL (ref 3.8–4.8)
Alkaline Phosphatase: 73 IU/L (ref 39–117)
BUN/Creatinine Ratio: 18 (ref 9–23)
BUN: 34 mg/dL — ABNORMAL HIGH (ref 6–24)
Bilirubin Total: 0.2 mg/dL (ref 0.0–1.2)
CO2: 21 mmol/L (ref 20–29)
Calcium: 9.4 mg/dL (ref 8.7–10.2)
Chloride: 103 mmol/L (ref 96–106)
Creatinine, Ser: 1.85 mg/dL — ABNORMAL HIGH (ref 0.57–1.00)
GFR calc Af Amer: 36 mL/min/{1.73_m2} — ABNORMAL LOW (ref >59)
GFR calc non Af Amer: 32 mL/min/{1.73_m2} — ABNORMAL LOW (ref >59)
Globulin, Total: 3.1 g/dL (ref 1.5–4.5)
Glucose: 104 mg/dL — ABNORMAL HIGH (ref 65–99)
Potassium: 5.4 mmol/L — ABNORMAL HIGH (ref 3.5–5.2)
Sodium: 137 mmol/L (ref 134–144)
Total Protein: 7.5 g/dL (ref 6.0–8.5)

## 2019-02-27 LAB — ARTHRITIS PANEL
Basophils Absolute: 0 10*3/uL (ref 0.0–0.2)
Basos: 0 %
EOS (ABSOLUTE): 0.2 10*3/uL (ref 0.0–0.4)
Eos: 3 %
Hematocrit: 36 % (ref 34.0–46.6)
Hemoglobin: 11.9 g/dL (ref 11.1–15.9)
Immature Grans (Abs): 0 10*3/uL (ref 0.0–0.1)
Immature Granulocytes: 1 %
Lymphocytes Absolute: 1.5 10*3/uL (ref 0.7–3.1)
Lymphs: 23 %
MCH: 30.6 pg (ref 26.6–33.0)
MCHC: 33.1 g/dL (ref 31.5–35.7)
MCV: 93 fL (ref 79–97)
Monocytes Absolute: 0.7 10*3/uL (ref 0.1–0.9)
Monocytes: 10 %
Neutrophils Absolute: 4.2 10*3/uL (ref 1.4–7.0)
Neutrophils: 63 %
Platelets: 235 10*3/uL (ref 150–450)
RBC: 3.89 x10E6/uL (ref 3.77–5.28)
RDW: 12.8 % (ref 11.7–15.4)
Rheumatoid fact SerPl-aCnc: <10 IU/mL (ref 0.0–13.9)
Sed Rate: 50 mm/hr — ABNORMAL HIGH (ref 0–32)
Uric Acid: 10.2 mg/dL — ABNORMAL HIGH (ref 2.5–7.1)
WBC: 6.6 10*3/uL (ref 3.4–10.8)

## 2019-02-27 LAB — ANA,IFA RA DIAG PNL W/RFLX TIT/PATN
ANA Titer 1: NEGATIVE
Cyclic Citrullin Peptide Ab: 4 units (ref 0–19)

## 2019-02-27 LAB — VITAMIN D 25 HYDROXY (VIT D DEFICIENCY, FRACTURES): Vit D, 25-Hydroxy: 31.3 ng/mL (ref 30.0–100.0)

## 2019-02-27 LAB — THYROID PANEL WITH TSH
Free Thyroxine Index: 2.8 (ref 1.2–4.9)
T3 Uptake Ratio: 43 % — ABNORMAL HIGH (ref 24–39)
T4, Total: 6.5 ug/dL (ref 4.5–12.0)
TSH: 1.91 u[IU]/mL (ref 0.450–4.500)

## 2019-02-27 LAB — VITAMIN B12: Vitamin B-12: 471 pg/mL (ref 232–1245)

## 2019-02-27 NOTE — Telephone Encounter (Signed)
Discussed labwork with patient and answered questions. Advised to hold med and return in 1 week. Patient verbalized understanding and agreed to rheumatology referral. Referral placed

## 2019-03-04 ENCOUNTER — Encounter: Payer: Self-pay | Admitting: Physician Assistant

## 2019-03-04 NOTE — Progress Notes (Signed)
BP 117/73   Pulse 63   Temp 97.8 F (36.6 C) (Temporal)   Ht _0  (1.6 m)   Wt 176 lb (79.8 kg)   LMP 01/25/2019   BMI 31.18 kg/m    Subjective:    Patient ID: Karen Robinson, female    DOB: [birth year], 48 y.o.   MRN: [id number]  HPI: Karen Robinson is a 48 y.o. female presenting on 02/25/2019 for Hypertension  Patient comes in for.  Recheck on her chronic medical conditions which do include hypertension.  She has had some very good readings and is tolerating everything very well.  She is tired and having some joint pains.  He states that she is cold all the, feels different than the rest of her family.  She does have a follow-up soon with her gastroenterologist.  She is due some labs we will have those performed today.  Refills will be sent.  And we will see her back in a few months.  Past Medical History:  Diagnosis Date  . Autoimmune hepatitis ([place])   . Hypertension    Relevant past medical, surgical, family and social history reviewed and updated as indicated. Interim medical history since our last visit reviewed. Allergies and medications reviewed and updated. DATA REVIEWED: CHART IN EPIC  Family History reviewed for pertinent findings.  Review of Systems  Constitutional: Positive for fatigue.  HENT: Negative.   Eyes: Negative.   Respiratory: Negative.   Cardiovascular: Negative.  Negative for chest pain, palpitations and leg swelling.  Gastrointestinal: Negative.   Genitourinary: Negative.   Musculoskeletal: Positive for arthralgias.    Allergies as of 02/25/2019   No Known Allergies     Medication List       Accurate as of February 25, 2019 11:59 PM. If you have any questions, ask your nurse or doctor.        STOP taking these medications   clobetasol ointment 0.05 % Commonly known as: TEMOVATE Stopped by: Terald Sleeper, PA-C     TAKE these medications   carvedilol 12.5 MG tablet Commonly known as: COREG Take 1 tablet (12.5 mg total) by mouth 2  (two) times daily with a meal. What changed:   medication strength  how much to take Changed by: Terald Sleeper, PA-C   spironolactone 50 MG tablet Commonly known as: ALDACTONE Take 1 tablet (50 mg total) by mouth daily. What changed: additional instructions Changed by: Terald Sleeper, PA-C   valsartan 320 MG tablet Commonly known as: DIOVAN Take 1 tablet (320 mg total) by mouth daily.          Objective:    BP 117/73   Pulse 63   Temp 97.8 F (36.6 C) (Temporal)   Ht _1  (1.6 m)   Wt 176 lb (79.8 kg)   LMP 01/25/2019   BMI 31.18 kg/m   No Known Allergies  Wt Readings from Last 3 Encounters:  02/25/19 176 lb (79.8 kg)  02/15/18 156 lb (70.8 kg)  01/02/18 163 lb (73.9 kg)    Physical Exam Constitutional:      General: She is not in acute distress.    Appearance: Normal appearance. She is well-developed.  HENT:     Head: Normocephalic and atraumatic.  Cardiovascular:     Rate and Rhythm: Normal rate.  Pulmonary:     Effort: Pulmonary effort is normal.  Skin:    General: Skin is warm and dry.     Findings: No rash.  Neurological:     Mental Status: She is alert and oriented to person, place, and time.     Deep Tendon Reflexes: Reflexes are normal and symmetric.     Results for orders placed or performed in visit on 02/25/19  Arthritis Panel  Result Value Ref Range   Uric Acid 10.2 (H) 2.5 - 7.1 mg/dL   Rhuematoid fact SerPl-aCnc <10.0 0.0 - 13.9 IU/mL   WBC 6.6 3.4 - 10.8 x10E3/uL   RBC 3.89 3.77 - 5.28 x10E6/uL   Hemoglobin 11.9 11.1 - 15.9 g/dL   Hematocrit 36.0 34.0 - 46.6 %   MCV 93 79 - 97 fL   MCH 30.6 26.6 - 33.0 pg   MCHC 33.1 31.5 - 35.7 g/dL   RDW 12.8 11.7 - 15.4 %   Platelets 235 150 - 450 x10E3/uL   Neutrophils 63 Not Estab. %   Lymphs 23 Not Estab. %   Monocytes 10 Not Estab. %   Eos 3 Not Estab. %   Basos 0 Not Estab. %   Neutrophils Absolute 4.2 1.4 - 7.0 x10E3/uL   Lymphocytes Absolute 1.5 0.7 - 3.1 x10E3/uL   Monocytes  Absolute 0.7 0.1 - 0.9 x10E3/uL   EOS (ABSOLUTE) 0.2 0.0 - 0.4 x10E3/uL   Basophils Absolute 0.0 0.0 - 0.2 x10E3/uL   Immature Granulocytes 1 Not Estab. %   Immature Grans (Abs) 0.0 0.0 - 0.1 x10E3/uL   Sed Rate 50 (H) 0 - 32 mm/hr  Thyroid Panel With TSH  Result Value Ref Range   TSH 1.910 0.450 - 4.500 uIU/mL   T4, Total 6.5 4.5 - 12.0 ug/dL   T3 Uptake Ratio 43 (H) 24 - 39 %   Free Thyroxine Index 2.8 1.2 - 4.9  CMP14+EGFR  Result Value Ref Range   Glucose 104 (H) 65 - 99 mg/dL   BUN 34 (H) 6 - 24 mg/dL   Creatinine, Ser 1.85 (H) 0.57 - 1.00 mg/dL   GFR calc non Af Amer 32 (L) >59 mL/min/1.73   GFR calc Af Amer 36 (L) >59 mL/min/1.73   BUN/Creatinine Ratio 18 9 - 23   Sodium 137 134 - 144 mmol/L   Potassium 5.4 (H) 3.5 - 5.2 mmol/L   Chloride 103 96 - 106 mmol/L   CO2 21 20 - 29 mmol/L   Calcium 9.4 8.7 - 10.2 mg/dL   Total Protein 7.5 6.0 - 8.5 g/dL   Albumin 4.4 3.8 - 4.8 g/dL   Globulin, Total 3.1 1.5 - 4.5 g/dL   Albumin/Globulin Ratio 1.4 1.2 - 2.2   Bilirubin Total 0.2 0.0 - 1.2 mg/dL   Alkaline Phosphatase 73 39 - 117 IU/L   AST 27 0 - 40 IU/L   ALT 32 0 - 32 IU/L  Lipid panel  Result Value Ref Range   Cholesterol, Total 163 100 - 199 mg/dL   Triglycerides 97 0 - 149 mg/dL   HDL 32 (L) >39 mg/dL   VLDL Cholesterol Cal 18 5 - 40 mg/dL   LDL Chol Calc (NIH) 113 (H) 0 - 99 mg/dL   Lipid Comment: CANCELED    Chol/HDL Ratio 5.1 (H) 0.0 - 4.4 ratio  VITAMIN D 25 Hydroxy (Vit-D Deficiency, Fractures)  Result Value Ref Range   Vit D, 25-Hydroxy 31.3 30.0 - 100.0 ng/mL  Vitamin B12  Result Value Ref Range   Vitamin B-12 471 232 - 1,245 pg/mL  ANA,IFA RA Diag Pnl w/rflx Tit/Patn  Result Value Ref Range   ANA Titer 1 Negative  Cyclic Citrullin Peptide Ab 4 0 - 19 units      Assessment & Plan:   1. Well adult exam - CBC with Differential/Platelet - CMP14+EGFR - Lipid panel - VITAMIN D 25 Hydroxy (Vit-D Deficiency, Fractures) - Vitamin B12  2. Hypokalemia  - CMP14+EGFR - spironolactone (ALDACTONE) 50 MG tablet; Take 1 tablet (50 mg total) by mouth daily.  Dispense: 90 tablet; Refill: 3  3. Essential hypertension - CBC with Differential/Platelet - CMP14+EGFR - Lipid panel - carvedilol (COREG) 12.5 MG tablet; Take 1 tablet (12.5 mg total) by mouth 2 (two) times daily with a meal.  Dispense: 60 tablet; Refill: 0 - spironolactone (ALDACTONE) 50 MG tablet; Take 1 tablet (50 mg total) by mouth daily.  Dispense: 90 tablet; Refill: 3 - valsartan (DIOVAN) 320 MG tablet; Take 1 tablet (320 mg total) by mouth daily.  Dispense: 90 tablet; Refill: 3  4. Autoimmune hepatitis ([place]) - Arthritis Panel - Thyroid Panel With TSH - Vitamin B12 - ANA,IFA RA Diag Pnl w/rflx Tit/Patn  5. Arthralgia, unspecified joint - Arthritis Panel - Thyroid Panel With TSH - Vitamin B12 - ANA,IFA RA Diag Pnl w/rflx Tit/Patn  6. Colon cancer screening - Cologuard   Continue all other maintenance medications as listed above.  Follow up plan: rechekc 3 months  Educational handout given for [place] PA-C [facility] [address]  [place], [place] [address] [phone]   03/04/2019, 8:37 PM

## 2019-03-08 ENCOUNTER — Other Ambulatory Visit: Payer: Self-pay

## 2019-03-08 ENCOUNTER — Other Ambulatory Visit: Payer: BLUE CROSS/BLUE SHIELD

## 2019-03-08 DIAGNOSIS — N289 Disorder of kidney and ureter, unspecified: Secondary | ICD-10-CM

## 2019-03-08 DIAGNOSIS — E876 Hypokalemia: Secondary | ICD-10-CM

## 2019-03-09 LAB — CMP14+EGFR
ALT: 33 IU/L — ABNORMAL HIGH (ref 0–32)
AST: 29 IU/L (ref 0–40)
Albumin/Globulin Ratio: 1.6 (ref 1.2–2.2)
Albumin: 4.4 g/dL (ref 3.8–4.8)
Alkaline Phosphatase: 69 IU/L (ref 39–117)
BUN/Creatinine Ratio: 13 (ref 9–23)
BUN: 19 mg/dL (ref 6–24)
Bilirubin Total: 0.4 mg/dL (ref 0.0–1.2)
CO2: 23 mmol/L (ref 20–29)
Calcium: 9.5 mg/dL (ref 8.7–10.2)
Chloride: 105 mmol/L (ref 96–106)
Creatinine, Ser: 1.51 mg/dL — ABNORMAL HIGH (ref 0.57–1.00)
GFR calc Af Amer: 46 mL/min/{1.73_m2} — ABNORMAL LOW (ref >59)
GFR calc non Af Amer: 40 mL/min/{1.73_m2} — ABNORMAL LOW (ref >59)
Globulin, Total: 2.7 g/dL (ref 1.5–4.5)
Glucose: 96 mg/dL (ref 65–99)
Potassium: 4.7 mmol/L (ref 3.5–5.2)
Sodium: 142 mmol/L (ref 134–144)
Total Protein: 7.1 g/dL (ref 6.0–8.5)

## 2019-03-09 LAB — MICROALBUMIN / CREATININE URINE RATIO
Creatinine, Urine: 107.8 mg/dL
Microalb/Creat Ratio: 7 mg/g creat (ref 0–29)
Microalbumin, Urine: 7.5 ug/mL

## 2019-03-11 ENCOUNTER — Other Ambulatory Visit: Payer: Self-pay

## 2019-03-11 DIAGNOSIS — E875 Hyperkalemia: Secondary | ICD-10-CM

## 2019-03-14 NOTE — Progress Notes (Deleted)
Office Visit Note  Patient: Karen Robinson             Date of Birth: [birth year]           MRN: [id number]             PCP: Terald Sleeper, PA-C Referring: Terald Sleeper, PA-C Visit Date: 03/28/2019 Occupation: @GUAROCC @  Subjective:  No chief complaint on file.   History of Present Illness: Karen Robinson is a 48 y.o. female ***   Activities of Daily Living:  Patient reports morning stiffness for *** {minute/hour:19697}.   Patient {ACTIONS;DENIES/REPORTS:21021675::"Denies"} nocturnal pain.  Difficulty dressing/grooming: {ACTIONS;DENIES/REPORTS:21021675::"Denies"} Difficulty climbing stairs: {ACTIONS;DENIES/REPORTS:21021675::"Denies"} Difficulty getting out of chair: {ACTIONS;DENIES/REPORTS:21021675::"Denies"} Difficulty using hands for taps, buttons, cutlery, and/or writing: {ACTIONS;DENIES/REPORTS:21021675::"Denies"}  No Rheumatology ROS completed.   PMFS History:  Patient Active Problem List   Diagnosis Date Noted  . Hypokalemia 02/19/2018  . Shift work sleep disorder 07/24/2017  . HTN (hypertension), malignant 12/12/2016  . Coccydynia 12/12/2016  . Essential hypertension 04/22/2016  . Autoimmune hepatitis ([place]) 04/22/2016    Past Medical History:  Diagnosis Date  . Autoimmune hepatitis ([place])   . Hypertension     Family History  Problem Relation Age of Onset  . Hypertension Mother   . Hypertension Maternal Grandmother   . Stroke Maternal Grandmother    Past Surgical History:  Procedure Laterality Date  . CESAREAN SECTION    . CHOLECYSTECTOMY     Social History   Social History Narrative  . Not on file   Immunization History  Administered Date(s) Administered  . DTaP 10/01/1970, 10/29/1970, 04/06/1972, 04/02/1973, 01/01/1996  . IPV 10/01/1970, 04/06/1972, 11/03/1972, 03/28/1974  . Influenza Inj Mdck Quad Pf 03/30/2018     Objective: Vital Signs: There were no vitals taken for this visit.   Physical Exam   Musculoskeletal Exam: ***   CDAI Exam: CDAI Score: - Patient Global: -; Provider Global: - Swollen: -; Tender: - Joint Exam   No joint exam has been documented for this visit   There is currently no information documented on the homunculus. Go to the Rheumatology activity and complete the homunculus joint exam.  Investigation: Findings:  02/25/19: ANA negative, CCP 4, RF<10, sed rate 50, TSH 1.91, T4 6.5, T3 43, uric acid 10.2, Vitamin B12 471, Vitamin D 31.3   Component     Latest Ref Rng & Units 02/25/2019  ANA Titer 1      Negative  Cyclic Citrullin Peptide Ab     0 - 19 units 4  Vitamin D, 25-Hydroxy     30.0 - 100.0 ng/mL 31.3  Vitamin B12     232 - 1,245 pg/mL 471   Imaging: No results found.  Recent Labs: Lab Results  Component Value Date   WBC 6.6 02/25/2019   HGB 11.9 02/25/2019   PLT 235 02/25/2019   NA 142 03/08/2019   K 4.7 03/08/2019   CL 105 03/08/2019   CO2 23 03/08/2019   GLUCOSE 96 03/08/2019   BUN 19 03/08/2019   CREATININE 1.51 (H) 03/08/2019   BILITOT 0.4 03/08/2019   ALKPHOS 69 03/08/2019   AST 29 03/08/2019   ALT 33 (H) 03/08/2019   PROT 7.1 03/08/2019   ALBUMIN 4.4 03/08/2019   CALCIUM 9.5 03/08/2019   GFRAA 46 (L) 03/08/2019    Speciality Comments: No specialty comments available.  Procedures:  No procedures performed Allergies: Patient has no known allergies.   Assessment / Plan:     Visit Diagnoses:  No diagnosis found.  Orders: No orders of the defined types were placed in this encounter.  No orders of the defined types were placed in this encounter.   Face-to-face time spent with patient was *** minutes. Greater than 50% of time was spent in counseling and coordination of care.  Follow-Up Instructions: No follow-ups on file.   Gearldine Bienenstockaylor M Jessi Jessop, PA-C  Note - This record has been created using Dragon software.  Chart creation errors have been sought, but may not always  have been located. Such creation errors do not reflect on  the standard of  medical care.

## 2019-03-19 ENCOUNTER — Telehealth: Payer: Self-pay | Admitting: Physician Assistant

## 2019-03-19 NOTE — Telephone Encounter (Signed)
Continue these medications and continue to track BP, can call again next week

## 2019-03-19 NOTE — Telephone Encounter (Signed)
Patient aware.

## 2019-03-19 NOTE — Telephone Encounter (Signed)
Patient states that BP readings have been elevated since Saturday.  Patient states that she has had a slight headache(tingly).  Stopped Spironolactone. Still taking carvedilol and valsartan.   136/86  144/78 144/81 134/91

## 2019-03-28 ENCOUNTER — Ambulatory Visit: Payer: Self-pay | Admitting: Rheumatology

## 2019-04-03 ENCOUNTER — Telehealth: Payer: Self-pay | Admitting: Physician Assistant

## 2019-04-03 ENCOUNTER — Other Ambulatory Visit: Payer: Self-pay | Admitting: Physician Assistant

## 2019-04-03 MED ORDER — HYDROCHLOROTHIAZIDE 25 MG PO TABS: 25.0000 mg | ORAL_TABLET | Freq: Every day | ORAL | 0 refills | Status: DC

## 2019-04-03 NOTE — Telephone Encounter (Signed)
There were 3 days of the blood pressure was great, did the "not feeling right "occurred during those days?  And the notes that that you had a headache today, and yes blood pressures are up slightly, but not enough to make a big adjustment in your medicine.  It may overshoot and make you go low.  If you feel like you are having some other sensation in your head and are not feeling right in a bad way it is okay for you to go to the hospital to get checked out.  Such as if you are thinking you are having a stroke.  Other any particular symptoms that you are having other than the not feeling right?

## 2019-04-03 NOTE — Telephone Encounter (Signed)
She states that the spironolactone was the medication that was stopped due to her lab work about a month ago. You stopped the spironolactone a month ago due to labs.

## 2019-04-03 NOTE — Telephone Encounter (Signed)
Patient states that she is having a vibration in her head daily. She states that her bp has not been right since d/c the third medication she was on. She states bp stayed level when you had her on that third medication. She states that the spironolactone was the medication that was stopped due to her lab work about a month ago.

## 2019-04-03 NOTE — Telephone Encounter (Signed)
Spoke with patient and she is very concerned about her BPs. I told her what you had said.  She thinks they are way too high. Wants to know if you will review them again. She just feels like something isn't right.

## 2019-04-03 NOTE — Telephone Encounter (Signed)
Can she tolerate a weaker fluid pill like HCTZ 25 mg?  Spironolactone was discontinued because it made her kidneys to be a little too dry.  I think if we try the hydrochlorothiazide 25 mg 1/2-1 daily and let her continue to monitor the blood pressure.  This should not give a dramatic drop in the blood pressure but hopefully be just enough to help her feel little bit better.  Let us plan to do labs in 2 weeks and have her in an office visit in 2 to 3 weeks also.  I will send the medication onto the pharmacy.

## 2019-04-03 NOTE — Telephone Encounter (Signed)
   03/19/19 8:41 AM Note   Patient states that BP readings have been elevated since Saturday.  Patient states that she has had a slight headache(tingly).  Stopped Spironolactone. Still taking carvedilol and valsartan.   136/86  144/78 144/81 134/91     Why did she stop the spironolactone in the first place?

## 2019-04-03 NOTE — Telephone Encounter (Signed)
Patient notified and verbalized understanding. Appt made for follow up appt for BP check and blood work in 2 weeks.

## 2019-04-03 NOTE — Telephone Encounter (Signed)
Those are pretty good readings overall. Continue to monitor. Ok to use tylenol for your headache.  It does not raise her blood pressure.

## 2019-04-03 NOTE — Telephone Encounter (Signed)
BP readings for nurse  9/26 Last Sat 1:00 pm 120/73 9/27 Sun 4:00 pm -112/69 9/29 Tues AM- 124/85 10/6 Tues PM 164/94 10/7 Wed AM 148/96 Pt states that her head hurts today.

## 2019-04-15 ENCOUNTER — Other Ambulatory Visit: Payer: Self-pay

## 2019-04-16 ENCOUNTER — Ambulatory Visit: Payer: BLUE CROSS/BLUE SHIELD | Admitting: Physician Assistant

## 2019-04-17 ENCOUNTER — Ambulatory Visit: Payer: Self-pay | Admitting: Rheumatology

## 2019-05-07 ENCOUNTER — Ambulatory Visit: Payer: BLUE CROSS/BLUE SHIELD | Admitting: Physician Assistant

## 2019-05-31 ENCOUNTER — Other Ambulatory Visit: Payer: Self-pay

## 2019-05-31 ENCOUNTER — Other Ambulatory Visit: Payer: BLUE CROSS/BLUE SHIELD

## 2019-05-31 DIAGNOSIS — E875 Hyperkalemia: Secondary | ICD-10-CM

## 2019-06-01 LAB — CMP14+EGFR
ALT: 38 IU/L — ABNORMAL HIGH (ref 0–32)
AST: 28 IU/L (ref 0–40)
Albumin/Globulin Ratio: 1.4 (ref 1.2–2.2)
Albumin: 4.1 g/dL (ref 3.8–4.8)
Alkaline Phosphatase: 79 IU/L (ref 39–117)
BUN/Creatinine Ratio: 8 — ABNORMAL LOW (ref 9–23)
BUN: 14 mg/dL (ref 6–24)
Bilirubin Total: 0.2 mg/dL (ref 0.0–1.2)
CO2: 24 mmol/L (ref 20–29)
Calcium: 9.2 mg/dL (ref 8.7–10.2)
Chloride: 105 mmol/L (ref 96–106)
Creatinine, Ser: 1.73 mg/dL — ABNORMAL HIGH (ref 0.57–1.00)
GFR calc Af Amer: 39 mL/min/{1.73_m2} — ABNORMAL LOW (ref >59)
GFR calc non Af Amer: 34 mL/min/{1.73_m2} — ABNORMAL LOW (ref >59)
Globulin, Total: 2.9 g/dL (ref 1.5–4.5)
Glucose: 78 mg/dL (ref 65–99)
Potassium: 4.2 mmol/L (ref 3.5–5.2)
Sodium: 142 mmol/L (ref 134–144)
Total Protein: 7 g/dL (ref 6.0–8.5)

## 2019-06-04 ENCOUNTER — Telehealth: Payer: Self-pay | Admitting: Physician Assistant

## 2019-06-04 ENCOUNTER — Other Ambulatory Visit: Payer: Self-pay | Admitting: Physician Assistant

## 2019-06-04 DIAGNOSIS — I10 Essential (primary) hypertension: Secondary | ICD-10-CM

## 2019-06-04 DIAGNOSIS — N1831 Chronic kidney disease, stage 3a: Secondary | ICD-10-CM | POA: Insufficient documentation

## 2019-06-04 NOTE — Telephone Encounter (Signed)
Pt wanted to verify should she still be taking HCTZ? She was told back in August to d/c the spironolactone which she did so she hasn't been on it since August.

## 2019-06-04 NOTE — Telephone Encounter (Signed)
Pt states she hasn't been checking her BP and I did d/c both meds from her med list.

## 2019-06-04 NOTE — Telephone Encounter (Signed)
thanks

## 2019-06-04 NOTE — Telephone Encounter (Signed)
Ok, then discontinue the HCTZ also. Fluid pills can dry out kidneys. How are her BP readings?  Please take these off her med list.

## 2019-06-05 ENCOUNTER — Telehealth: Payer: Self-pay | Admitting: Physician Assistant

## 2019-06-06 ENCOUNTER — Other Ambulatory Visit: Payer: Self-pay

## 2019-06-07 ENCOUNTER — Ambulatory Visit: Payer: BLUE CROSS/BLUE SHIELD | Admitting: Physician Assistant

## 2019-06-07 ENCOUNTER — Encounter: Payer: Self-pay | Admitting: Physician Assistant

## 2019-06-07 ENCOUNTER — Ambulatory Visit (INDEPENDENT_AMBULATORY_CARE_PROVIDER_SITE_OTHER): Payer: BLUE CROSS/BLUE SHIELD

## 2019-06-07 VITALS — BP 151/80 | HR 77 | Temp 100.0°F | Resp 20 | Ht 63.0 in | Wt 185.0 lb

## 2019-06-07 DIAGNOSIS — G8929 Other chronic pain: Secondary | ICD-10-CM

## 2019-06-07 DIAGNOSIS — M545 Low back pain, unspecified: Secondary | ICD-10-CM

## 2019-06-07 DIAGNOSIS — I10 Essential (primary) hypertension: Secondary | ICD-10-CM | POA: Diagnosis not present

## 2019-06-07 DIAGNOSIS — R635 Abnormal weight gain: Secondary | ICD-10-CM

## 2019-06-07 MED ORDER — TOPIRAMATE 50 MG PO TABS: 50.0000 mg | ORAL_TABLET | Freq: Two times a day (BID) | ORAL | 0 refills | Status: DC

## 2019-06-07 MED ORDER — CARVEDILOL 25 MG PO TABS: 25.0000 mg | ORAL_TABLET | Freq: Two times a day (BID) | ORAL | 2 refills | Status: DC

## 2019-06-07 MED ORDER — CYCLOBENZAPRINE HCL 10 MG PO TABS: 10.0000 mg | ORAL_TABLET | Freq: Three times a day (TID) | ORAL | 0 refills | Status: DC | PRN

## 2019-06-07 NOTE — Patient Instructions (Signed)

## 2019-06-09 ENCOUNTER — Encounter: Payer: Self-pay | Admitting: Physician Assistant

## 2019-06-10 ENCOUNTER — Telehealth: Payer: Self-pay | Admitting: Physician Assistant

## 2019-06-10 ENCOUNTER — Other Ambulatory Visit: Payer: Self-pay | Admitting: Physician Assistant

## 2019-06-10 DIAGNOSIS — I10 Essential (primary) hypertension: Secondary | ICD-10-CM

## 2019-06-10 MED ORDER — CARVEDILOL 25 MG PO TABS: 37.5000 mg | ORAL_TABLET | Freq: Two times a day (BID) | ORAL | 2 refills | Status: DC

## 2019-06-12 ENCOUNTER — Telehealth: Payer: Self-pay | Admitting: Physician Assistant

## 2019-06-12 NOTE — Telephone Encounter (Signed)
Patient is aware of Ref that is being sent to [facility].

## 2019-06-18 ENCOUNTER — Other Ambulatory Visit (HOSPITAL_COMMUNITY): Payer: Self-pay | Admitting: Nephrology

## 2019-06-18 DIAGNOSIS — N183 Chronic kidney disease, stage 3 unspecified: Secondary | ICD-10-CM

## 2019-06-18 DIAGNOSIS — E1122 Type 2 diabetes mellitus with diabetic chronic kidney disease: Secondary | ICD-10-CM

## 2019-07-02 ENCOUNTER — Ambulatory Visit (HOSPITAL_COMMUNITY)
Admission: RE | Admit: 2019-07-02 | Discharge: 2019-07-02 | Disposition: A | Discharge status: Home / Self Care | Payer: BLUE CROSS/BLUE SHIELD | Source: Ambulatory Visit | Attending: Nephrology | Admitting: Nephrology

## 2019-07-02 ENCOUNTER — Other Ambulatory Visit: Payer: Self-pay

## 2019-07-02 DIAGNOSIS — N183 Chronic kidney disease, stage 3 unspecified: Secondary | ICD-10-CM | POA: Diagnosis present

## 2019-07-02 DIAGNOSIS — E1122 Type 2 diabetes mellitus with diabetic chronic kidney disease: Secondary | ICD-10-CM | POA: Diagnosis present

## 2019-07-16 ENCOUNTER — Ambulatory Visit: Payer: BLUE CROSS/BLUE SHIELD | Admitting: Physician Assistant

## 2019-07-17 ENCOUNTER — Encounter: Payer: Self-pay | Admitting: Physician Assistant

## 2019-07-22 ENCOUNTER — Encounter (INDEPENDENT_AMBULATORY_CARE_PROVIDER_SITE_OTHER): Payer: Self-pay

## 2019-07-24 ENCOUNTER — Ambulatory Visit (INDEPENDENT_AMBULATORY_CARE_PROVIDER_SITE_OTHER): Payer: BLUE CROSS/BLUE SHIELD | Admitting: Physician Assistant

## 2019-07-24 ENCOUNTER — Encounter: Payer: Self-pay | Admitting: Physician Assistant

## 2019-07-24 ENCOUNTER — Other Ambulatory Visit: Payer: Self-pay

## 2019-07-24 DIAGNOSIS — U071 COVID-19: Secondary | ICD-10-CM

## 2019-07-24 NOTE — Progress Notes (Signed)
     Telephone visit  Subjective: CC: Covid infection PCP: Remus Loffler, PA-C SHF:WYOVZCH A Burkel is a 49 y.o. female calls for telephone consult today. Patient provides verbal consent for consult held via phone.  Patient is identified with 2 separate identifiers.  At this time the entire area is on COVID-19 social distancing and stay home orders are in place.  Patient is of higher risk and therefore we are performing this by a virtual method.  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  They had a positive Covid test.  So she got tested.  Her test was positive on 07/19/2019.  She states that she is really not having any significant symptoms, possible slight headache and a little sinus congestion.  But she states that she is not having any other symptoms at this time.  We discussed that the current protocol is for her to have 10 days after having had a positive test.  And if she is symptom-free for 3 days prior to her return to work then she can return in 10 days out.  That would be February 1 for her.  Her work is okay with this plan.   ROS: Per HPI  No Known Allergies Past Medical History:  Diagnosis Date  . Autoimmune hepatitis ([facility])   . Hypertension     Current Outpatient Medications:  .  carvedilol (COREG) 25 MG tablet, Take 1.5 tablets (37.5 mg total) by mouth 2 (two) times daily with a meal., Disp: 90 tablet, Rfl: 2 .  cyclobenzaprine (FLEXERIL) 10 MG tablet, Take 1 tablet (10 mg total) by mouth 3 (three) times daily as needed for muscle spasms., Disp: 40 tablet, Rfl: 0 .  topiramate (TOPAMAX) 50 MG tablet, Take 1 tablet (50 mg total) by mouth 2 (two) times daily., Disp: 60 tablet, Rfl: 0 .  valsartan (DIOVAN) 320 MG tablet, Take 1 tablet (320 mg total) by mouth daily., Disp: 90 tablet, Rfl: 3  Assessment/ Plan: 49 y.o. female   1. COVID-19 Supportive care Zinc and vitamin C Call  if any worsening of symptoms   No follow-ups on file.  Continue all other maintenance medications as listed above.  Start time: 3:52 PM End time: 3:58 PM  No orders of the defined types were placed in this encounter.   Prudy Feeler PA-C [facility] [phone]

## 2019-07-29 ENCOUNTER — Ambulatory Visit (INDEPENDENT_AMBULATORY_CARE_PROVIDER_SITE_OTHER): Payer: Self-pay | Admitting: Gastroenterology

## 2019-08-23 ENCOUNTER — Ambulatory Visit: Payer: BLUE CROSS/BLUE SHIELD | Admitting: Physician Assistant

## 2020-03-01 IMAGING — DX DG LUMBAR SPINE 2-3V
2 series · 2 of 2 positions shown · non-contrast
Comparison: None.

CLINICAL DATA: Chronic left low back pain

EXAM:
LUMBAR SPINE - 2-3 VIEW

[l-spine ap]
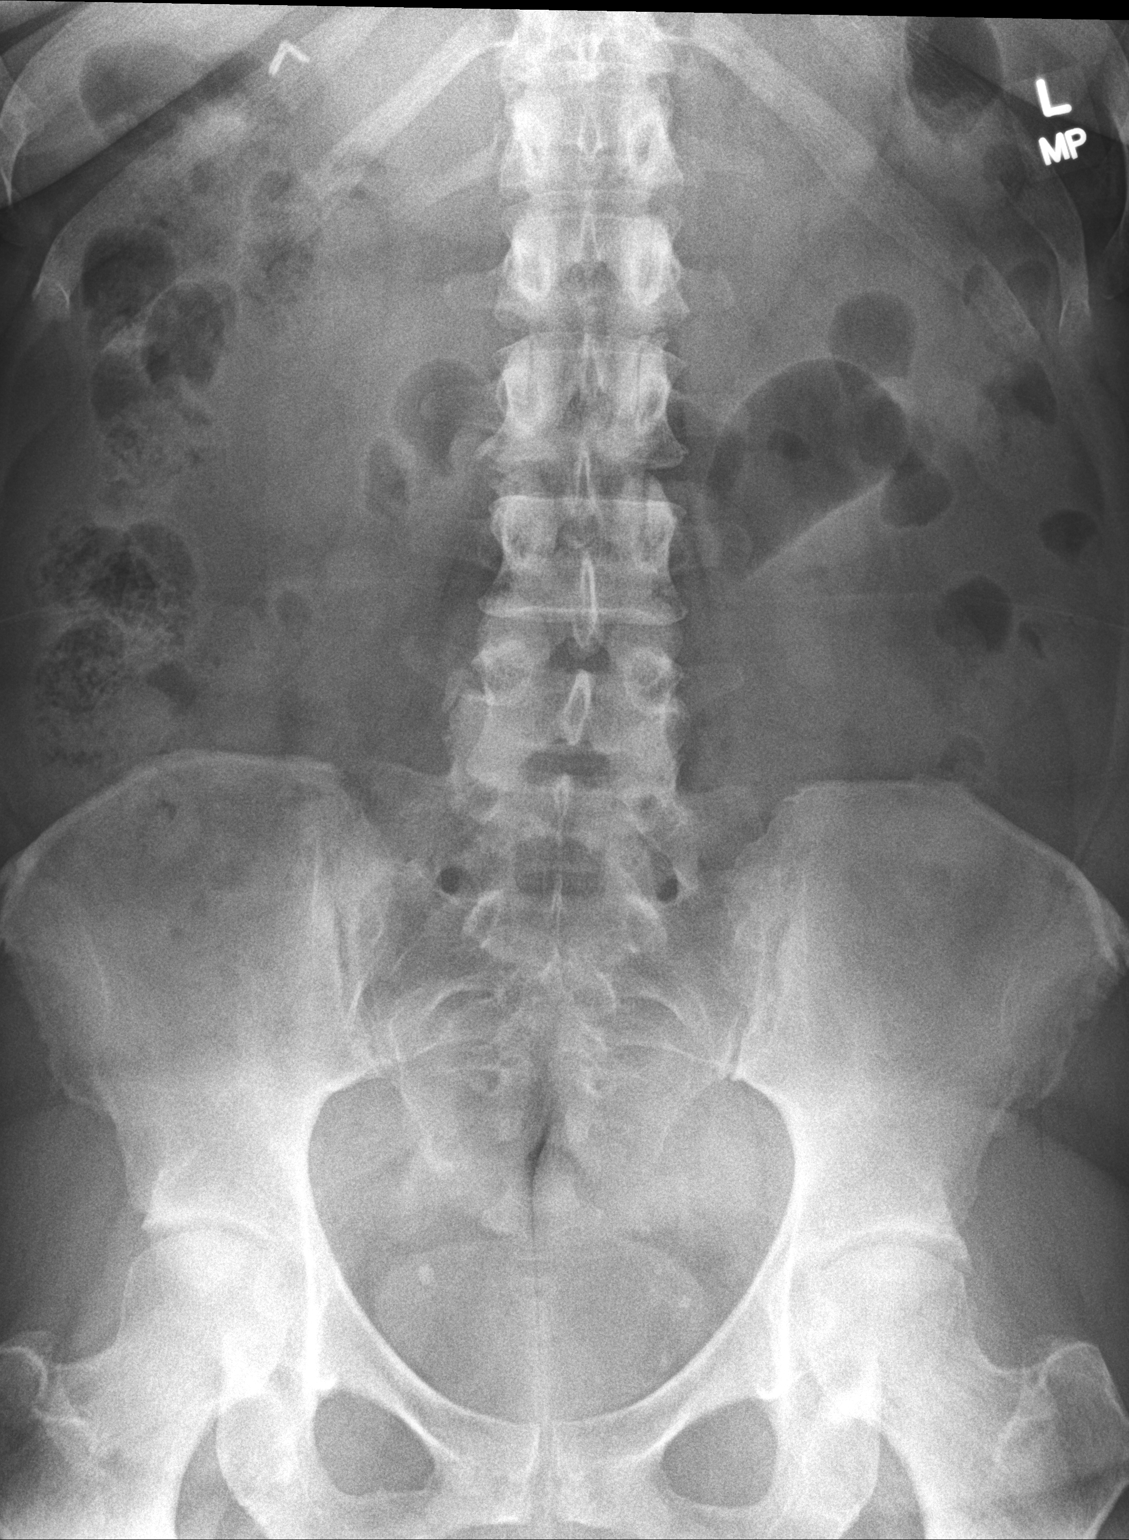

[l-spine lat]
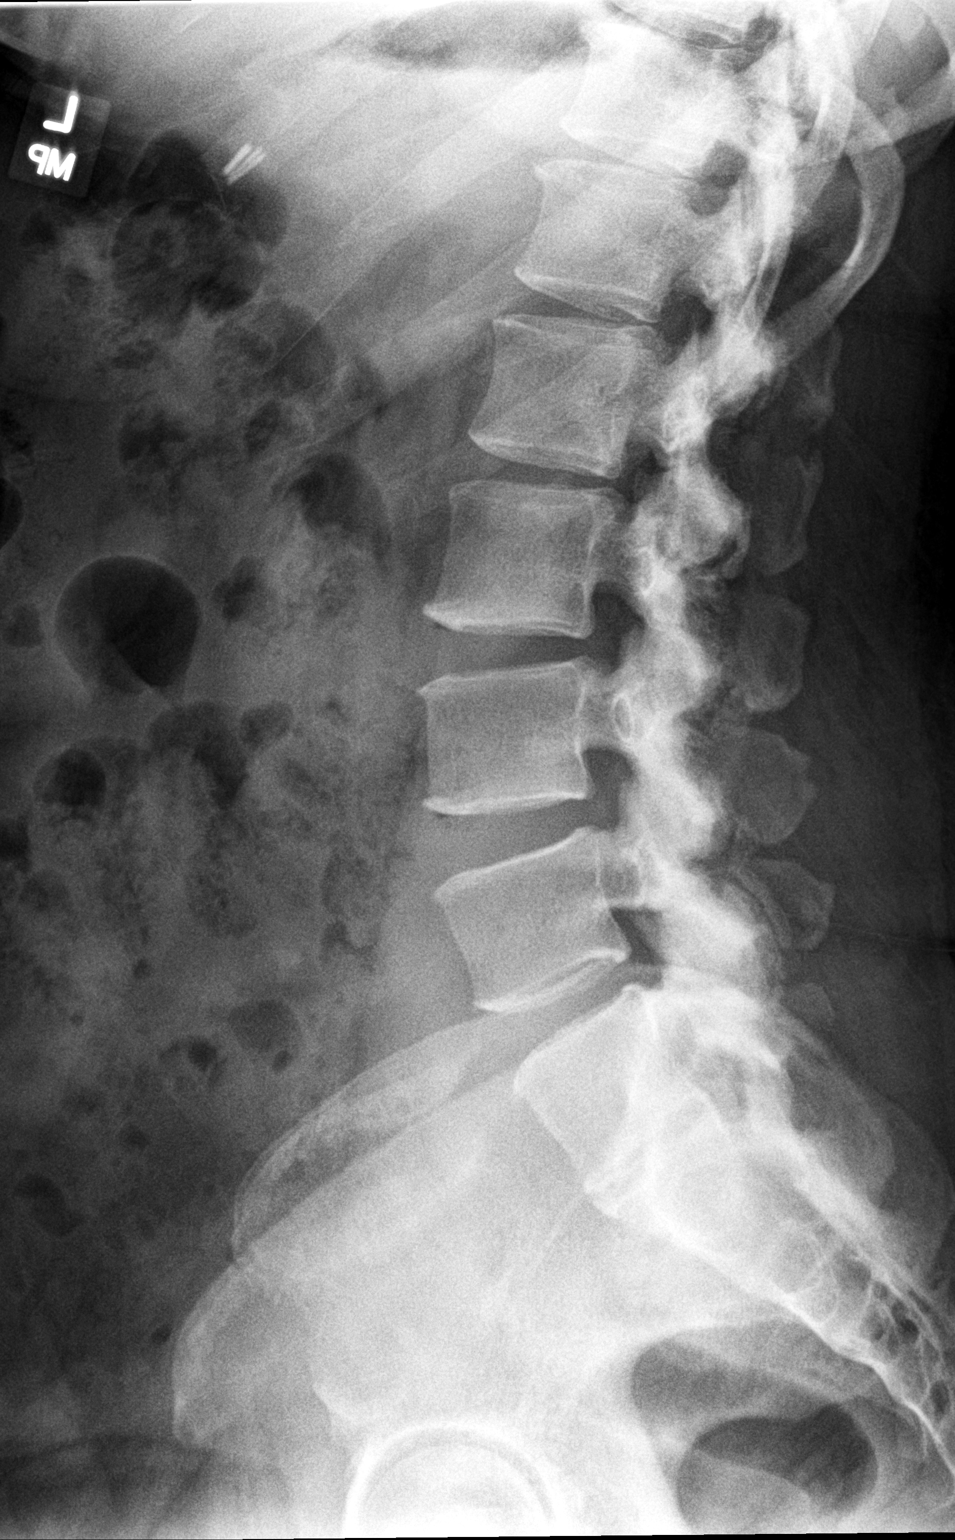

[2 of 2 positions shown; findings below may reference images not displayed]

FINDINGS: There are rudimentary ribs at the lowest rib-bearing vertebral
levels. Five non-rib-bearing lumbar type vertebral bodies are noted
with partial sacralization of the L5 transverse processes. No acute
fracture or vertebral body height loss is seen. Relative
preservation of the intervertebral disc heights. Mild-to-moderate
facet degenerative changes most pronounced at L2-3 and L5-S1. Bones
of the pelvis are intact and congruent. Normal bowel gas pattern.
Cholecystectomy clips in the right upper quadrant. Phleboliths in
the pelvis. Remaining soft tissues are unremarkable.
IMPRESSION: 1. No acute bony abnormality.
2. Mild-to-moderate facet degenerative changes in the lower lumbar
spine.
3. Partial sacralization of the L5 transverse processes. Could
correlate for clinical features of Bertolotti syndrome in the
setting of chronic back pain.

## 2020-03-26 IMAGING — US US RENAL
1 series · 14 of 25 positions shown · non-contrast
Comparison: None.

CLINICAL DATA: 48-year-old female with hypertension and diabetes.
Chronic kidney disease.

EXAM:
RENAL / URINARY TRACT ULTRASOUND COMPLETE

[Series 1: us renal · 0.23mm/px · 14 of 92 slices shown]
[im 1/92]
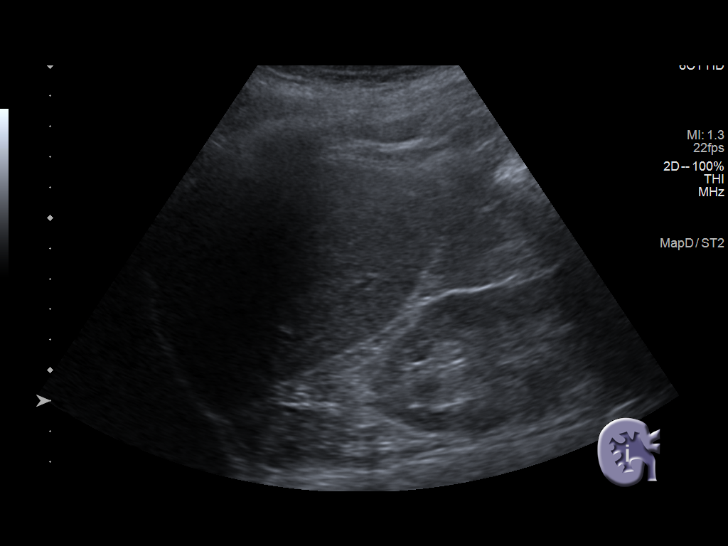
[im 8/92]
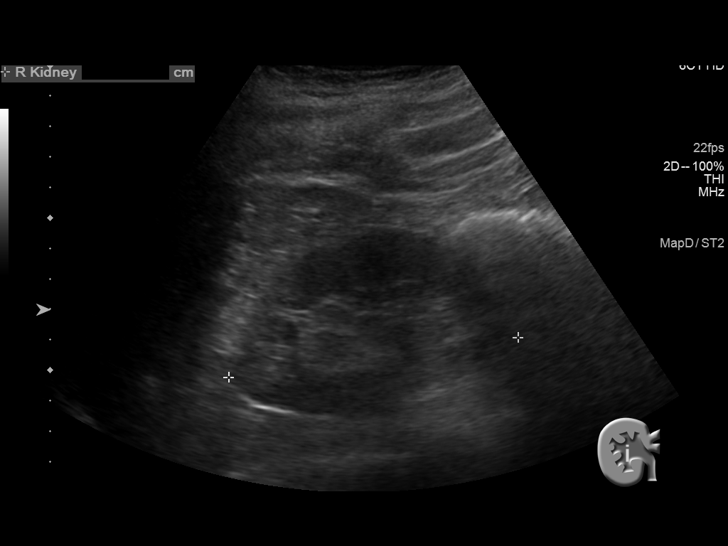
[im 16/92]
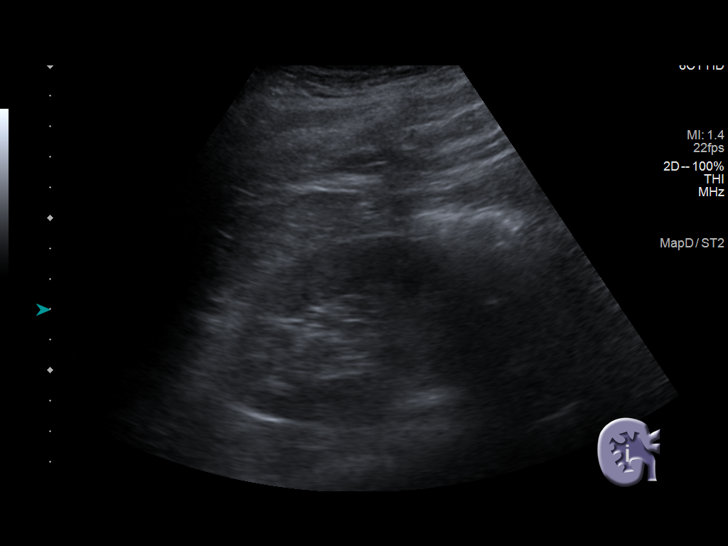
[im 23/92]
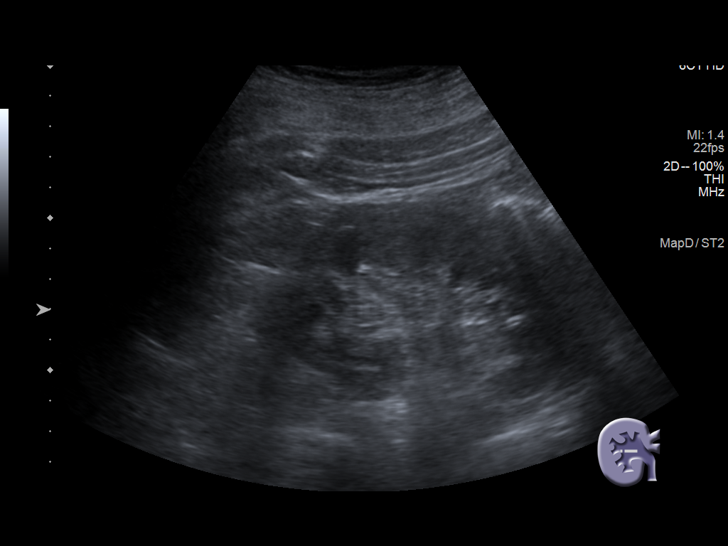
[im 31/92]
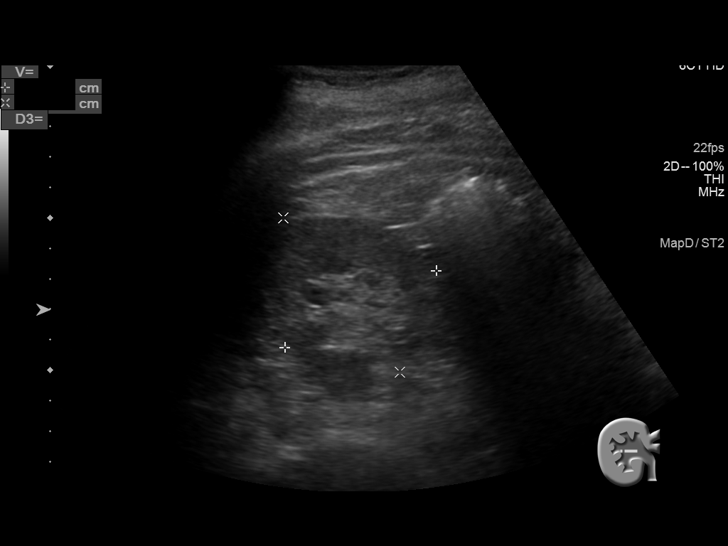
[im 35/92]
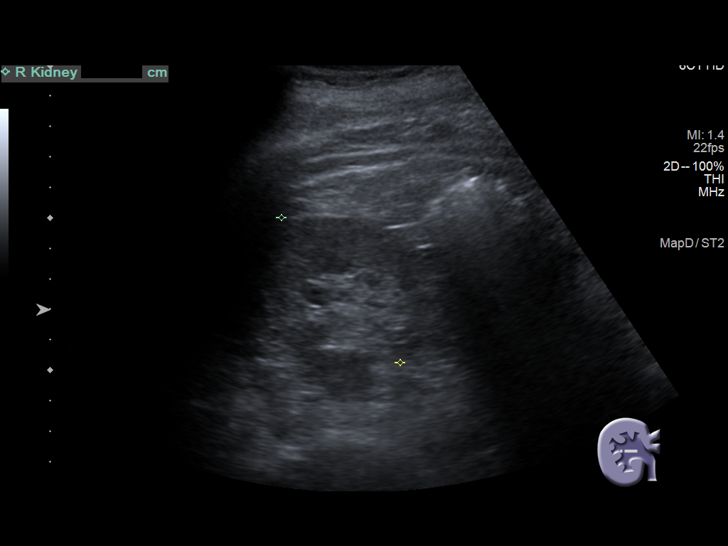
[im 42/92]
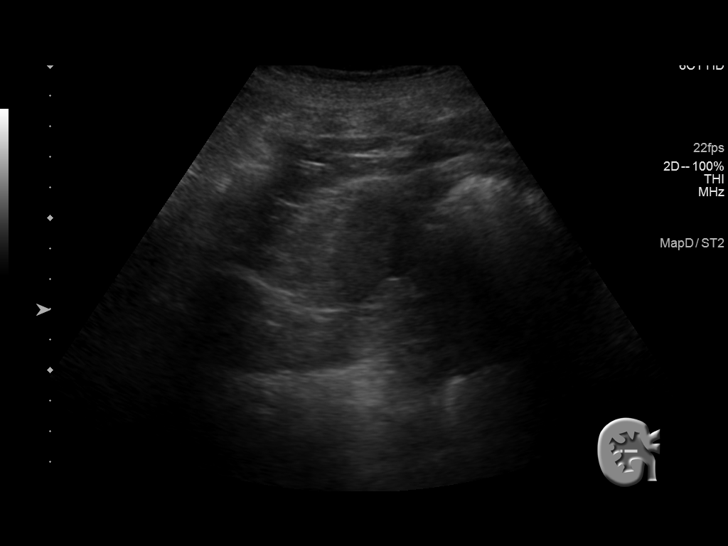
[im 50/92]
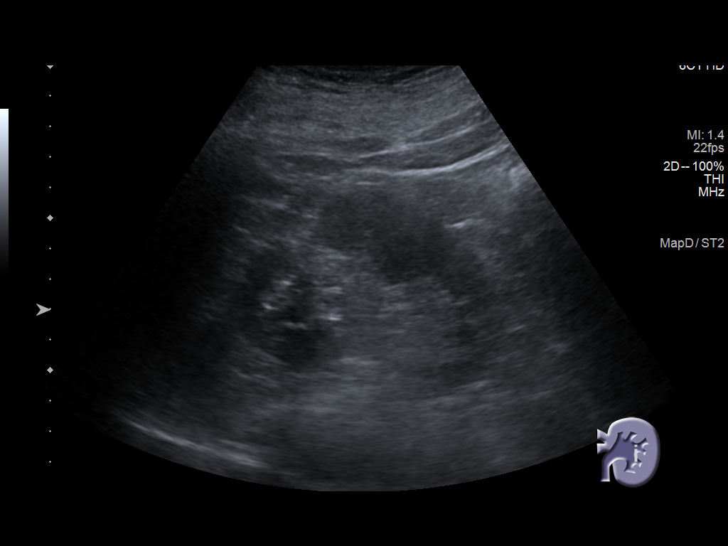
[im 57/92]
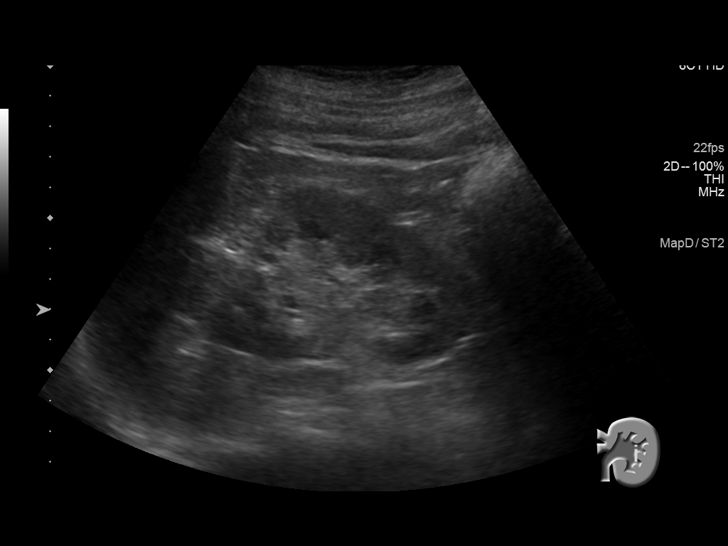
[im 61/92]
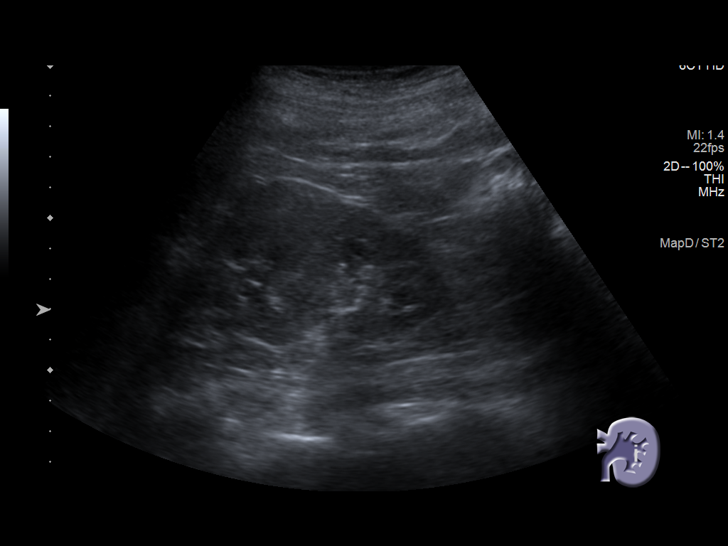
[im 69/92]
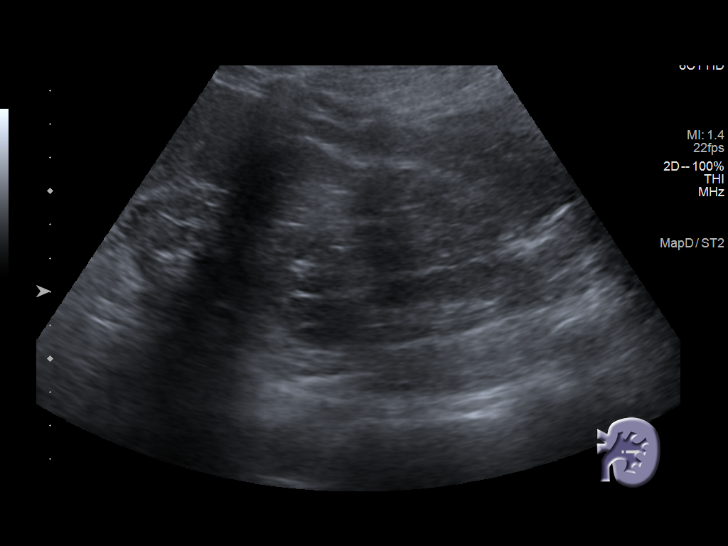
[im 76/92]
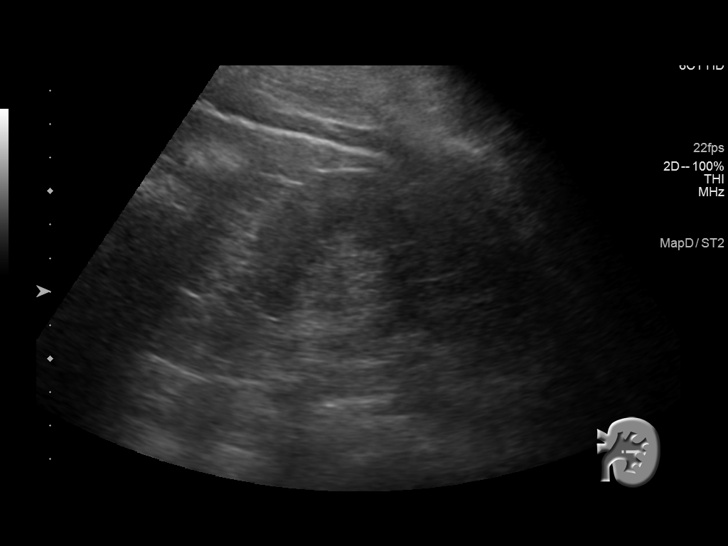
[im 84/92]
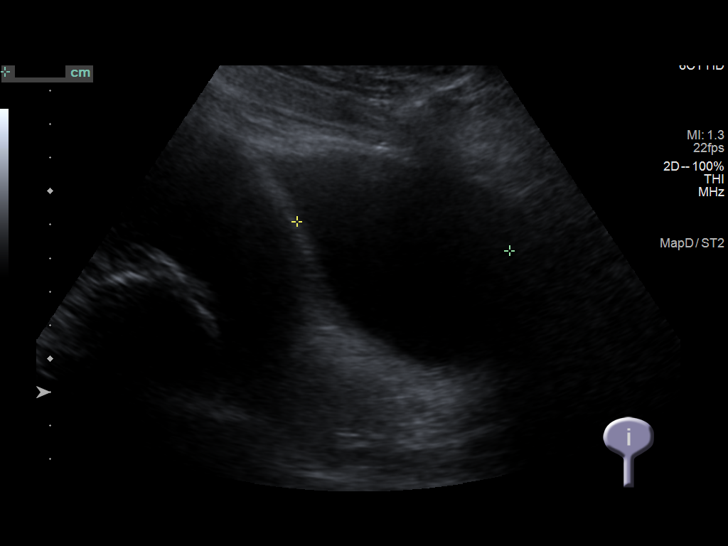
[im 92/92]
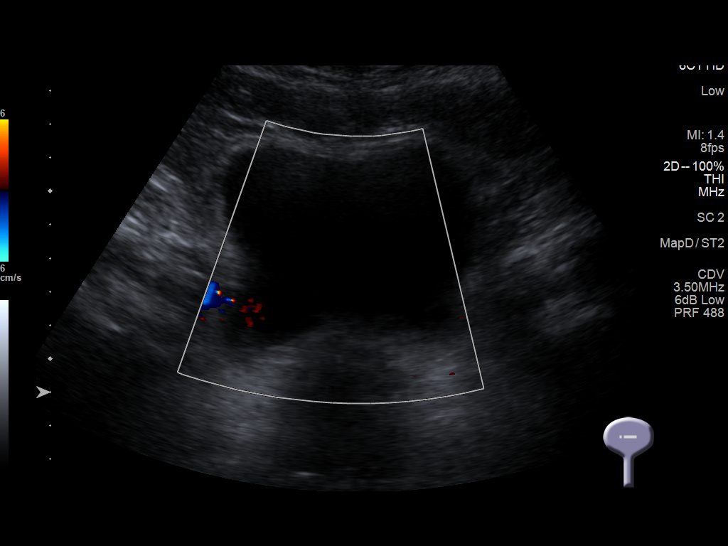

[14 of 25 positions shown; findings below may reference images not displayed]

FINDINGS: Right Kidney:

Renal measurements: 9.9 x 6.6 x 5.8 cm = volume: 184 mL. Normal
echogenicity. No hydronephrosis or shadowing stone.

Left Kidney:

Renal measurements: 10.0 x 5.3 x 5.2 cm = volume: 147 mL. Normal
echogenicity. No hydronephrosis or shadowing stone. There is slight
irregularity of the superior pole of the left kidney which may be
congenital or represent an area of scarring.

Bladder:

Appears normal for degree of bladder distention.

Other:

None.
IMPRESSION: Unremarkable renal ultrasound.

## 2021-08-25 ENCOUNTER — Other Ambulatory Visit (HOSPITAL_COMMUNITY)
Admission: RE | Admit: 2021-08-25 | Discharge: 2021-08-25 | Disposition: A | Discharge status: Home / Self Care | Payer: Managed Care, Other (non HMO) | Source: Ambulatory Visit | Attending: Nephrology | Admitting: Nephrology

## 2021-08-25 DIAGNOSIS — I129 Hypertensive chronic kidney disease with stage 1 through stage 4 chronic kidney disease, or unspecified chronic kidney disease: Secondary | ICD-10-CM | POA: Insufficient documentation

## 2021-08-25 DIAGNOSIS — R809 Proteinuria, unspecified: Secondary | ICD-10-CM | POA: Insufficient documentation

## 2021-08-25 DIAGNOSIS — E876 Hypokalemia: Secondary | ICD-10-CM | POA: Diagnosis present

## 2021-08-25 DIAGNOSIS — N182 Chronic kidney disease, stage 2 (mild): Secondary | ICD-10-CM | POA: Diagnosis not present

## 2021-08-25 LAB — RENAL FUNCTION PANEL
Albumin: 4.4 g/dL (ref 3.5–5.0)
Anion gap: 9 (ref 5–15)
BUN: 17 mg/dL (ref 6–20)
CO2: 26 mmol/L (ref 22–32)
Calcium: 9.5 mg/dL (ref 8.9–10.3)
Chloride: 103 mmol/L (ref 98–111)
Creatinine, Ser: 1.13 mg/dL — ABNORMAL HIGH (ref 0.44–1.00)
GFR, Estimated: 59 mL/min — ABNORMAL LOW (ref >60)
Glucose, Bld: 93 mg/dL (ref 70–99)
Phosphorus: 3.4 mg/dL (ref 2.5–4.6)
Potassium: 3.7 mmol/L (ref 3.5–5.1)
Sodium: 138 mmol/L (ref 135–145)

## 2022-09-05 ENCOUNTER — Encounter: Payer: Self-pay | Admitting: Family Medicine

## 2022-09-05 ENCOUNTER — Ambulatory Visit: Payer: 59 | Admitting: Family Medicine

## 2022-09-05 ENCOUNTER — Other Ambulatory Visit: Payer: Self-pay | Admitting: Family Medicine

## 2022-09-05 VITALS — BP 158/90 | HR 74 | Ht 64.0 in | Wt 164.0 lb

## 2022-09-05 DIAGNOSIS — E559 Vitamin D deficiency, unspecified: Secondary | ICD-10-CM

## 2022-09-05 DIAGNOSIS — N951 Menopausal and female climacteric states: Secondary | ICD-10-CM

## 2022-09-05 DIAGNOSIS — E7849 Other hyperlipidemia: Secondary | ICD-10-CM

## 2022-09-05 DIAGNOSIS — R7301 Impaired fasting glucose: Secondary | ICD-10-CM

## 2022-09-05 DIAGNOSIS — Z1211 Encounter for screening for malignant neoplasm of colon: Secondary | ICD-10-CM

## 2022-09-05 DIAGNOSIS — Z114 Encounter for screening for human immunodeficiency virus [HIV]: Secondary | ICD-10-CM

## 2022-09-05 DIAGNOSIS — E0789 Other specified disorders of thyroid: Secondary | ICD-10-CM

## 2022-09-05 DIAGNOSIS — R200 Anesthesia of skin: Secondary | ICD-10-CM | POA: Diagnosis not present

## 2022-09-05 DIAGNOSIS — Z1159 Encounter for screening for other viral diseases: Secondary | ICD-10-CM

## 2022-09-05 DIAGNOSIS — Z23 Encounter for immunization: Secondary | ICD-10-CM | POA: Diagnosis not present

## 2022-09-05 DIAGNOSIS — I10 Essential (primary) hypertension: Secondary | ICD-10-CM | POA: Diagnosis not present

## 2022-09-05 MED ORDER — PAROXETINE MESYLATE 7.5 MG PO CAPS: 1.0000 | ORAL_CAPSULE | Freq: Every day | ORAL | 0 refills | Status: DC

## 2022-09-05 NOTE — Patient Instructions (Addendum)
I appreciate the opportunity to provide care to you today!    Follow up:  2 weeks for BP  Labs: please stop by the lab during the week to get your blood drawn (CBC, CMP, TSH, Lipid profile, HgA1c, Vit D)  Screening: HIV and Hep C  Please pick up your prescription at the pharmacy and start taking Paxil 7.5 mg daily  I want you to check your blood pressure daily and bring your readings with you at your next appointment Your blood pressure goal is less than 140/90 I recommend low-sodium diet with increased physical activity      Please continue to a heart-healthy diet and increase your physical activities. Try to exercise for 11mns at least five times a week.      It was a pleasure to see you and I look forward to continuing to work together on your health and well-being. Please do not hesitate to call the office if you need care or have questions about your care.   Have a wonderful day and week. With Gratitude, GAlvira MondayMSN, FNP-BC

## 2022-09-05 NOTE — Assessment & Plan Note (Addendum)
Last menstrual cycle was 4 months ago She complains of symptoms of hot flashes, night sweats, and sleep disturbance No suicidal thoughts or ideation reported Will start a trial of Paxil 7.5 mg daily Encouraged not to abruptly discontinue therapy

## 2022-09-05 NOTE — Assessment & Plan Note (Signed)
Complains of occasional numbness in the fingers that is self-limiting No history of diabetes reported Will assess hemoglobin A1c and monitor it for now

## 2022-09-05 NOTE — Progress Notes (Signed)
New Patient Office Visit  Subjective:  Patient ID: Karen Robinson, female    DOB: 1970/12/20  Age: 52 y.o. MRN: [id number]  CC:  Chief Complaint  Patient presents with   Establish Care    New patient, establishing care. Would like to discuss about premenopausal sx, tips of her fingers become numb at times, since 08/07/22.    HPI Karen Robinson is a 52 y.o. female with past medical history of chronic kidney disease and  hypertension presents for establishing care. For the details of today's visit, please refer to the assessment and plan.     Past Medical History:  Diagnosis Date   Autoimmune hepatitis ([place])    Hypertension     Past Surgical History:  Procedure Laterality Date   CESAREAN SECTION     CHOLECYSTECTOMY      Family History  Problem Relation Age of Onset   Hypertension Mother    Hypertension Maternal Grandmother    Stroke Maternal Grandmother     Social History   Socioeconomic History   Marital status: Married    Spouse name: Not on file   Number of children: Not on file   Years of education: Not on file   Highest education level: Not on file  Occupational History   Not on file  Tobacco Use   Smoking status: Never   Smokeless tobacco: Never  Vaping Use   Vaping Use: Never used  Substance and Sexual Activity   Alcohol use: No   Drug use: No   Sexual activity: Not Currently  Other Topics Concern   Not on file  Social History Narrative   Not on file   Social Determinants of Health   Financial Resource Strain: Not on file  Food Insecurity: Not on file  Transportation Needs: Not on file  Physical Activity: Not on file  Stress: Not on file  Social Connections: Not on file  Intimate Partner Violence: Not on file    ROS Review of Systems  Constitutional:  Negative for chills and fever.  Eyes:  Negative for visual disturbance.  Respiratory:  Negative for chest tightness and shortness of breath.   Neurological:  Positive for numbness.  Negative for dizziness and headaches.    Objective:   Today's Vitals: BP (!) 158/90 (BP Location: Left Arm)   Pulse 74   Ht '5\' 4"'$  (1.626 m)   Wt 164 lb (74.4 kg)   SpO2 96%   BMI 28.15 kg/m   Physical Exam HENT:     Head: Normocephalic.     Mouth/Throat:     Mouth: Mucous membranes are moist.  Cardiovascular:     Rate and Rhythm: Normal rate.     Heart sounds: Normal heart sounds.  Pulmonary:     Effort: Pulmonary effort is normal.     Breath sounds: Normal breath sounds.  Neurological:     Mental Status: She is alert.      Assessment & Plan:   Essential hypertension Assessment & Plan: Uncontrolled Of note, the patient has a history of chronic kidney disease and follows up with Dr. Theador Hawthorne her nephrologist She takes chlorthalidone 12.5 mg daily, valsartan 20 mg daily, and carvedilol 12.5 mg twice daily She reports ambulatory readings in the Q000111Q systolic and 123XX123 diastolic Patient is asymptomatic today in the clinic Encouraged patient to assess her blood pressure daily for 2 weeks and bring her ambulatory readings with her at her follow-up appointment Encourage a low-sodium diet with increased physical activity Encouraged  to continue the treatment regimen and will make adjustments to her BP medication if her BP is above 140/90 after the next visit BP Readings from Last 3 Encounters:  09/05/22 (!) 158/90  06/07/19 (!) 151/80  02/25/19 117/73      Perimenopausal vasomotor symptoms Assessment & Plan: Last menstrual cycle was 4 months ago She complains of symptoms of hot flashes, night sweats, and sleep disturbance No suicidal thoughts or ideation reported Will start a trial of Paxil 7.5 mg daily Encouraged not to abruptly discontinue therapy  Orders: -     PARoxetine Mesylate; Take 1 tablet by mouth daily.  Dispense: 30 capsule; Refill: 0  Numbness Assessment & Plan: Complains of occasional numbness in the fingers that is self-limiting No history of diabetes  reported Will assess hemoglobin A1c and monitor it for now    Other specified disorders of thyroid -     TSH + free T4  Other hyperlipidemia -     CBC with Differential/Platelet -     CMP14+EGFR -     Lipid panel  Impaired fasting blood sugar -     Hemoglobin A1c  Vitamin D deficiency -     VITAMIN D 25 Hydroxy (Vit-D Deficiency, Fractures)  Encounter for hepatitis C screening test for low risk patient -     Hepatitis C antibody  Colon cancer screening -     Ambulatory referral to Gastroenterology  Immunization due -     Varicella-zoster vaccine IM  Encounter for screening for HIV -     HIV Antibody (routine testing w rflx)     Follow-up: Return in about 2 weeks (around 09/19/2022) for BP.   Alvira Monday, FNP

## 2022-09-05 NOTE — Assessment & Plan Note (Signed)
Uncontrolled Of note, the patient has a history of chronic kidney disease and follows up with Dr. Theador Hawthorne her nephrologist She takes chlorthalidone 12.5 mg daily, valsartan 20 mg daily, and carvedilol 12.5 mg twice daily She reports ambulatory readings in the Q000111Q systolic and 123XX123 diastolic Patient is asymptomatic today in the clinic Encouraged patient to assess her blood pressure daily for 2 weeks and bring her ambulatory readings with her at her follow-up appointment Encourage a low-sodium diet with increased physical activity Encouraged to continue the treatment regimen and will make adjustments to her BP medication if her BP is above 140/90 after the next visit BP Readings from Last 3 Encounters:  09/05/22 (!) 158/90  06/07/19 (!) 151/80  02/25/19 117/73

## 2022-09-06 ENCOUNTER — Telehealth: Payer: Self-pay

## 2022-09-06 ENCOUNTER — Encounter: Payer: Self-pay | Admitting: *Deleted

## 2022-09-06 ENCOUNTER — Telehealth: Payer: Self-pay | Admitting: Family Medicine

## 2022-09-06 NOTE — Telephone Encounter (Signed)
Pt called stating bp was 147/94, advised pt to keep monitoring bp daily per Gloria's notes from ov on 09/05/22, Peter Congo will see her in 2 weeks and readjust medications then.

## 2022-09-07 ENCOUNTER — Other Ambulatory Visit: Payer: Self-pay | Admitting: Family Medicine

## 2022-09-07 DIAGNOSIS — N951 Menopausal and female climacteric states: Secondary | ICD-10-CM

## 2022-09-07 MED ORDER — CITALOPRAM HYDROBROMIDE 20 MG PO TABS: 20.0000 mg | ORAL_TABLET | Freq: Every day | ORAL | 3 refills | Status: DC

## 2022-09-07 NOTE — Telephone Encounter (Signed)
Please inform the patient that the prescription for Celexa 20 mg to take daily has been sent to her pharmacy for her hot flashes.

## 2022-09-21 ENCOUNTER — Encounter: Payer: Self-pay | Admitting: Family Medicine

## 2022-09-21 ENCOUNTER — Ambulatory Visit: Payer: 59 | Admitting: Family Medicine

## 2022-09-21 VITALS — BP 128/82 | HR 66 | Ht 63.0 in | Wt 164.1 lb

## 2022-09-21 DIAGNOSIS — I10 Essential (primary) hypertension: Secondary | ICD-10-CM

## 2022-09-21 LAB — CBC WITH DIFFERENTIAL/PLATELET
Basophils Absolute: 0 10*3/uL (ref 0.0–0.2)
Basos: 0 %
EOS (ABSOLUTE): 0.3 10*3/uL (ref 0.0–0.4)
Eos: 4 %
Hematocrit: 37 % (ref 34.0–46.6)
Hemoglobin: 12.5 g/dL (ref 11.1–15.9)
Immature Grans (Abs): 0 10*3/uL (ref 0.0–0.1)
Immature Granulocytes: 0 %
Lymphocytes Absolute: 1.6 10*3/uL (ref 0.7–3.1)
Lymphs: 21 %
MCH: 30.4 pg (ref 26.6–33.0)
MCHC: 33.8 g/dL (ref 31.5–35.7)
MCV: 90 fL (ref 79–97)
Monocytes Absolute: 0.6 10*3/uL (ref 0.1–0.9)
Monocytes: 9 %
Neutrophils Absolute: 5 10*3/uL (ref 1.4–7.0)
Neutrophils: 66 %
Platelets: 230 10*3/uL (ref 150–450)
RBC: 4.11 x10E6/uL (ref 3.77–5.28)
RDW: 12.3 % (ref 11.7–15.4)
WBC: 7.6 10*3/uL (ref 3.4–10.8)

## 2022-09-21 LAB — LIPID PANEL
Chol/HDL Ratio: 4.5 ratio — ABNORMAL HIGH (ref 0.0–4.4)
Cholesterol, Total: 186 mg/dL (ref 100–199)
HDL: 41 mg/dL (ref >39)
LDL Chol Calc (NIH): 122 mg/dL — ABNORMAL HIGH (ref 0–99)
Triglycerides: 126 mg/dL (ref 0–149)
VLDL Cholesterol Cal: 23 mg/dL (ref 5–40)

## 2022-09-21 LAB — CMP14+EGFR
ALT: 15 IU/L (ref 0–32)
AST: 19 IU/L (ref 0–40)
Albumin/Globulin Ratio: 1.4 (ref 1.2–2.2)
Albumin: 4.3 g/dL (ref 3.8–4.9)
Alkaline Phosphatase: 90 IU/L (ref 44–121)
BUN/Creatinine Ratio: 17 (ref 9–23)
BUN: 19 mg/dL (ref 6–24)
Bilirubin Total: 0.2 mg/dL (ref 0.0–1.2)
CO2: 24 mmol/L (ref 20–29)
Calcium: 9.5 mg/dL (ref 8.7–10.2)
Chloride: 104 mmol/L (ref 96–106)
Creatinine, Ser: 1.13 mg/dL — ABNORMAL HIGH (ref 0.57–1.00)
Globulin, Total: 3.1 g/dL (ref 1.5–4.5)
Glucose: 101 mg/dL — ABNORMAL HIGH (ref 70–99)
Potassium: 4 mmol/L (ref 3.5–5.2)
Sodium: 144 mmol/L (ref 134–144)
Total Protein: 7.4 g/dL (ref 6.0–8.5)
eGFR: 59 mL/min/{1.73_m2} — ABNORMAL LOW (ref >59)

## 2022-09-21 LAB — TSH+FREE T4
Free T4: 1.06 ng/dL (ref 0.82–1.77)
TSH: 1.7 u[IU]/mL (ref 0.450–4.500)

## 2022-09-21 LAB — VITAMIN D 25 HYDROXY (VIT D DEFICIENCY, FRACTURES): Vit D, 25-Hydroxy: 44.6 ng/mL (ref 30.0–100.0)

## 2022-09-21 LAB — HEMOGLOBIN A1C
Est. average glucose Bld gHb Est-mCnc: 105 mg/dL
Hgb A1c MFr Bld: 5.3 % (ref 4.8–5.6)

## 2022-09-21 LAB — HEPATITIS C ANTIBODY: Hep C Virus Ab: NONREACTIVE

## 2022-09-21 LAB — HIV ANTIBODY (ROUTINE TESTING W REFLEX): HIV Screen 4th Generation wRfx: NONREACTIVE

## 2022-09-21 NOTE — Progress Notes (Signed)
Established Patient Office Visit  Subjective:  Patient ID: Karen Robinson, female    DOB: [birth year]  Age: 52 y.o. MRN: [id number]  CC:  Chief Complaint  Patient presents with   Follow-up    Htn follow up     HPI Karen Robinson is a 52 y.o. female with past medical history of hypertension and chronic kidney disease presents for blood pressure follow-up. For the details of today's visit, please refer to the assessment and plan.     Past Medical History:  Diagnosis Date   Autoimmune hepatitis ([place])    Hypertension     Past Surgical History:  Procedure Laterality Date   CESAREAN SECTION     CHOLECYSTECTOMY      Family History  Problem Relation Age of Onset   Hypertension Mother    Hypertension Maternal Grandmother    Stroke Maternal Grandmother     Social History   Socioeconomic History   Marital status: Married    Spouse name: Not on file   Number of children: Not on file   Years of education: Not on file   Highest education level: Not on file  Occupational History   Not on file  Tobacco Use   Smoking status: Never   Smokeless tobacco: Never  Vaping Use   Vaping Use: Never used  Substance and Sexual Activity   Alcohol use: No   Drug use: No   Sexual activity: Not Currently  Other Topics Concern   Not on file  Social History Narrative   Not on file   Social Determinants of Health   Financial Resource Strain: Not on file  Food Insecurity: Not on file  Transportation Needs: Not on file  Physical Activity: Not on file  Stress: Not on file  Social Connections: Not on file  Intimate Partner Violence: Not on file    Outpatient Medications Prior to Visit  Medication Sig Dispense Refill   Bacillus Coagulans-Inulin (PROBIOTIC-PREBIOTIC PO) Take by mouth.     carvedilol (COREG) 25 MG tablet Take by mouth.     chlorthalidone (HYGROTON) 25 MG tablet Take 25 mg by mouth daily. Take 0.5 tablets by mouth daily     valsartan (DIOVAN) 40 MG tablet Take  40 mg by mouth daily. Take 0.5 tablets by mouth daily     citalopram (CELEXA) 20 MG tablet Take 1 tablet (20 mg total) by mouth daily. (Patient not taking: Reported on 09/21/2022) 30 tablet 3   carvedilol (COREG) 12.5 MG tablet Take 12.5 mg by mouth 2 (two) times daily with a meal.     No facility-administered medications prior to visit.    Allergies  Allergen Reactions   Nifedipine Swelling    Made her gums swell    ROS Review of Systems  Constitutional:  Negative for chills and fever.  Eyes:  Negative for visual disturbance.  Respiratory:  Negative for chest tightness and shortness of breath.   Neurological:  Negative for dizziness and headaches.      Objective:    Physical Exam HENT:     Head: Normocephalic.     Mouth/Throat:     Mouth: Mucous membranes are moist.  Cardiovascular:     Rate and Rhythm: Normal rate.     Heart sounds: Normal heart sounds.  Pulmonary:     Effort: Pulmonary effort is normal.     Breath sounds: Normal breath sounds.  Neurological:     Mental Status: She is alert.     BP 128/82 (  BP Location: Left Arm)   Pulse 66   Ht 5\' 3"  (1.6 m)   Wt 164 lb 1.9 oz (74.4 kg)   SpO2 100%   BMI 29.07 kg/m  Wt Readings from Last 3 Encounters:  09/21/22 164 lb 1.9 oz (74.4 kg)  09/05/22 164 lb (74.4 kg)  06/07/19 185 lb (83.9 kg)    Lab Results  Component Value Date   TSH 1.700 09/19/2022   Lab Results  Component Value Date   WBC 7.6 09/19/2022   HGB 12.5 09/19/2022   HCT 37.0 09/19/2022   MCV 90 09/19/2022   PLT 230 09/19/2022   Lab Results  Component Value Date   NA 144 09/19/2022   K 4.0 09/19/2022   CO2 24 09/19/2022   GLUCOSE 101 (H) 09/19/2022   BUN 19 09/19/2022   CREATININE 1.13 (H) 09/19/2022   BILITOT 0.2 09/19/2022   ALKPHOS 90 09/19/2022   AST 19 09/19/2022   ALT 15 09/19/2022   PROT 7.4 09/19/2022   ALBUMIN 4.3 09/19/2022   CALCIUM 9.5 09/19/2022   ANIONGAP 9 08/25/2021   EGFR 59 (L) 09/19/2022   Lab Results   Component Value Date   CHOL 186 09/19/2022   Lab Results  Component Value Date   HDL 41 09/19/2022   Lab Results  Component Value Date   LDLCALC 122 (H) 09/19/2022   Lab Results  Component Value Date   TRIG 126 09/19/2022   Lab Results  Component Value Date   CHOLHDL 4.5 (H) 09/19/2022   Lab Results  Component Value Date   HGBA1C 5.3 09/19/2022      Assessment & Plan:  Essential hypertension Assessment & Plan: Controlled She takes chlorthalidone 12.5 mg daily, valsartan 20 mg daily, and carvedilol 25 mg twice daily She reports ambulatory readings in the [id number] systolic and [id number] diastolic Patient is asymptomatic today in the clinic Encourage a low-sodium diet with increased physical activity BP Readings from Last 3 Encounters:  09/21/22 128/82  09/05/22 (!) 158/90  06/07/19 (!) 151/80        Follow-up: Return in about 3 months (around 12/22/2022).   Alvira Monday, FNP

## 2022-09-21 NOTE — Assessment & Plan Note (Signed)
Controlled She takes chlorthalidone 12.5 mg daily, valsartan 20 mg daily, and carvedilol 25 mg twice daily She reports ambulatory readings in the [id number] systolic and [id number] diastolic Patient is asymptomatic today in the clinic Encourage a low-sodium diet with increased physical activity BP Readings from Last 3 Encounters:  09/21/22 128/82  09/05/22 (!) 158/90  06/07/19 (!) 151/80

## 2022-09-21 NOTE — Patient Instructions (Signed)
I appreciate the opportunity to provide care to you today!    Follow up:  3 months  Labs: next visit     Please continue to a heart-healthy diet and increase your physical activities. Try to exercise for 34mins at least five days a week.   Physical activity helps: Lower your blood glucose, improve your heart health, lower your blood pressure and cholesterol, burn calories to help manage her weight, gave you energy, lower stress, and improve his sleep.  The American diabetes Association (ADA) recommends being active for 2-1/2 hours (150 minutes) or more week.  Exercise for 30 minutes, 5 days a week (150 minutes total)    It was a pleasure to see you and I look forward to continuing to work together on your health and well-being. Please do not hesitate to call the office if you need care or have questions about your care.   Have a wonderful day and week. With Gratitude, Alvira Monday MSN, FNP-BC

## 2022-09-26 NOTE — Progress Notes (Signed)
Your cholesterol levels are elevated. I want your LDL to be less than 100. I recommend avoiding simple carbohydrates including cakes, sweet desserts, ice cream, soda (diet or regular), sweet tea, candies, chips, cookies, store-bought juices, alcohol in excess of 1-2 drinks a day, lemonade, artificial sweeteners, donuts, coffee creamers, and sugar-free products.  I recommend avoiding greasy, fatty foods with increased physical activity. Your thyroid, kidneys, and liver function are stable.

## 2022-12-23 ENCOUNTER — Ambulatory Visit: Payer: 59 | Admitting: Family Medicine

## 2023-01-02 ENCOUNTER — Ambulatory Visit: Payer: 59 | Admitting: Family Medicine

## 2023-01-06 ENCOUNTER — Encounter: Payer: Self-pay | Admitting: Family Medicine

## 2023-01-06 ENCOUNTER — Ambulatory Visit: Payer: 59 | Admitting: Family Medicine

## 2023-01-06 VITALS — BP 157/96 | HR 67 | Ht 63.0 in | Wt 164.0 lb

## 2023-01-06 DIAGNOSIS — R110 Nausea: Secondary | ICD-10-CM

## 2023-01-06 DIAGNOSIS — F419 Anxiety disorder, unspecified: Secondary | ICD-10-CM

## 2023-01-06 DIAGNOSIS — F410 Panic disorder [episodic paroxysmal anxiety] without agoraphobia: Secondary | ICD-10-CM

## 2023-01-06 MED ORDER — ALPRAZOLAM 0.25 MG PO TABS: 0.2500 mg | ORAL_TABLET | ORAL | 0 refills | Status: DC | PRN

## 2023-01-06 MED ORDER — ONDANSETRON HCL 4 MG PO TABS: 4.0000 mg | ORAL_TABLET | Freq: Three times a day (TID) | ORAL | 0 refills | Status: DC | PRN

## 2023-01-06 NOTE — Progress Notes (Unsigned)
Acute Office Visit  Subjective:    Patient ID: Karen Robinson, female    DOB: 09/28/1970, 52 y.o.   MRN: [id number]  Chief Complaint  Patient presents with   Panic Attack    Pt would like a medication for panic attack to take while flying, her flight is on 07/16.     HPI Patient is in today with reports that she will be traveling in a few days and the medication can help her calm down while her flight.  Past Medical History:  Diagnosis Date   Autoimmune hepatitis ([facility])    Hypertension     Past Surgical History:  Procedure Laterality Date   CESAREAN SECTION     CHOLECYSTECTOMY      Family History  Problem Relation Age of Onset   Hypertension Mother    Hypertension Maternal Grandmother    Stroke Maternal Grandmother     Social History   Socioeconomic History   Marital status: Married    Spouse name: Not on file   Number of children: Not on file   Years of education: Not on file   Highest education level: Not on file  Occupational History   Not on file  Tobacco Use   Smoking status: Never   Smokeless tobacco: Never  Vaping Use   Vaping status: Never Used  Substance and Sexual Activity   Alcohol use: No   Drug use: No   Sexual activity: Not Currently  Other Topics Concern   Not on file  Social History Narrative   Not on file   Social Determinants of Health   Financial Resource Strain: Not on file  Food Insecurity: Not on file  Transportation Needs: Not on file  Physical Activity: Not on file  Stress: Not on file  Social Connections: Unknown (11/09/2021)   Received from [facility]   Social Network    Social Network: Not on file  Intimate Partner Violence: Unknown (10/01/2021)   Received from [facility], [facility]   HITS    Physically Hurt: Not on file    Insult or Talk Down To: Not on file    Threaten Physical Harm: Not on file    Scream or Curse: Not on file    Outpatient Medications Prior to Visit  Medication Sig  Dispense Refill   Bacillus Coagulans-Inulin (PROBIOTIC-PREBIOTIC PO) Take by mouth.     carvedilol (COREG) 25 MG tablet Take by mouth.     chlorthalidone (HYGROTON) 25 MG tablet Take 25 mg by mouth daily. Take 0.5 tablets by mouth daily     valsartan (DIOVAN) 40 MG tablet Take 40 mg by mouth daily. Take 0.5 tablets by mouth daily     citalopram (CELEXA) 20 MG tablet Take 1 tablet (20 mg total) by mouth daily. (Patient not taking: Reported on 01/06/2023) 30 tablet 3   No facility-administered medications prior to visit.    Allergies  Allergen Reactions   Nifedipine Swelling    Made her gums swell    Review of Systems  Constitutional:  Negative for chills and fever.  Eyes:  Negative for visual disturbance.  Respiratory:  Negative for chest tightness and shortness of breath.   Neurological:  Negative for dizziness and headaches.       Objective:    Physical Exam HENT:     Head: Normocephalic.     Mouth/Throat:     Mouth: Mucous membranes are moist.  Cardiovascular:     Rate and Rhythm: Normal rate.  Heart sounds: Normal heart sounds.  Pulmonary:     Effort: Pulmonary effort is normal.     Breath sounds: Normal breath sounds.  Neurological:     Mental Status: She is alert.     BP (!) 157/96   Pulse 67   Ht 5\' 3"  (1.6 m)   Wt 164 lb (74.4 kg)   SpO2 96%   BMI 29.05 kg/m  Wt Readings from Last 3 Encounters:  01/06/23 164 lb (74.4 kg)  09/21/22 164 lb 1.9 oz (74.4 kg)  09/05/22 164 lb (74.4 kg)       Assessment & Plan:  Panic attack Assessment & Plan: Will provide short supply of Xanax 0.25 mg to take as needed Appropriate education provided and patient verbalized understanding  Orders: -     ALPRAZolam; Take 1 tablet (0.25 mg total) by mouth as needed for anxiety. May repeat dose 30 to 60 minutes after oral administration. Max:10 mg/day  Dispense: 8 tablet; Refill: 0  Nausea -     Ondansetron HCl; Take 1 tablet (4 mg total) by mouth every 8 (eight) hours  as needed for nausea or vomiting.  Dispense: 20 tablet; Refill: 0  Note: This chart has been completed using Engineer, civil (consulting) software, and while attempts have been made to ensure accuracy, certain words and phrases may not be transcribed as intended.    Gilmore Laroche, FNP

## 2023-01-06 NOTE — Patient Instructions (Addendum)
I appreciate the opportunity to provide care to you today!  Panic attacks   Xanax starts working within an hour and the medicine reaches peak concentrations in the body after one to two hours.  SE: May cause drowsiness and/or sedation  DO not drink alcohol while taking xanax as this can increases the risk for serious adverse effects  Attached with your AVS, you will find valuable resources for self-education. I highly recommend dedicating some time to thoroughly examine them.   Please continue to a heart-healthy diet and increase your physical activities. Try to exercise for at least five days a week.    It was a pleasure to see you and I look forward to continuing to work together on your health and well-being. Please do not hesitate to call the office if you need care or have questions about your care.  In case of emergency, please visit the Emergency Department for urgent care, or contact our clinic at [phone] to schedule an appointment. We're here to help you!   Have a wonderful day and week. With Gratitude, Gilmore Laroche MSN, FNP-BC'

## 2023-01-07 DIAGNOSIS — F410 Panic disorder [episodic paroxysmal anxiety] without agoraphobia: Secondary | ICD-10-CM | POA: Insufficient documentation

## 2023-01-07 NOTE — Assessment & Plan Note (Signed)
Will provide short supply of Xanax 0.25 mg to take as needed Appropriate education provided and patient verbalized understanding

## 2023-03-24 ENCOUNTER — Encounter: Payer: Self-pay | Admitting: *Deleted

## 2023-05-02 ENCOUNTER — Other Ambulatory Visit: Payer: Self-pay | Admitting: Family Medicine

## 2023-05-02 DIAGNOSIS — Z1231 Encounter for screening mammogram for malignant neoplasm of breast: Secondary | ICD-10-CM

## 2023-07-25 ENCOUNTER — Telehealth: Payer: Self-pay | Admitting: Family Medicine

## 2023-07-25 NOTE — Telephone Encounter (Signed)
Wants gastro referral placed with office in pcp building not [address]

## 2023-07-27 NOTE — Telephone Encounter (Signed)
Yes, approve for GI referral to be sent to the GI office on [address]

## 2023-08-01 ENCOUNTER — Encounter (INDEPENDENT_AMBULATORY_CARE_PROVIDER_SITE_OTHER): Payer: Self-pay | Admitting: *Deleted

## 2023-08-01 ENCOUNTER — Other Ambulatory Visit: Payer: Self-pay

## 2023-08-01 DIAGNOSIS — Z1211 Encounter for screening for malignant neoplasm of colon: Secondary | ICD-10-CM

## 2023-08-01 NOTE — Telephone Encounter (Signed)
Referral sent to office of pt. Choice. Awaiting cosign from provider.

## 2024-01-30 ENCOUNTER — Encounter (INDEPENDENT_AMBULATORY_CARE_PROVIDER_SITE_OTHER): Payer: Self-pay | Admitting: *Deleted

## 2024-05-16 NOTE — Progress Notes (Addendum)
 Patient ID:  Karen Robinson is a 53 y.o. (DOB Dec 28, 1970) female.   Subjective      Patient presents with  . New Patient  . Joint Pain  . Fatigue  . Back Pain    Middle back      HPI   New patient here today to establish care with PCP. Last PCP was [facility] primary [facility]. Patient lives in [place], [place]. Would like to coordinate labs and other care near her home but wants to stay here for PCP services.   Hypertension:  BP at home range from: 110-20/80s. Hx of white coat syndrome.  Compliant with current regimen. Coreg  25mg  BID, chlorthalidone 12.5mg , valsartan  40mg  daily.  Side effects such as dizziness or lightheadedness: no Denies hx of snoring or OSA. Compliant with low salt intake.   CKD stage3 /proteinuria CKD since 2019 secondary to hypertension/possibility of post renal issues as patient has scarring on left kidney - per nephrologist notes.  On ARB Denies NSAID or alcohol use.  Water intake is 16oz/day. Denies OTC medications. [facility]ist [facility]- every 6 months, next visit in Jan/Feb.  Patient hyperaldosteronism work-up came back negative   Autoimmune hepatitis Dx in 2008-2009 following acute liver injury. Followed [facility] and [place]. Used to take prednisone and then switched azathioprine for a couple years. Stopped regimens now and has not followed up since 2018-2019. Denies any GI symptoms for past 10+ years.   Liver biopsy in 2015 showed liver with 35% macrovesicular steatosis. No evidence of serohepatitis. No significant inflammatory activity is seen. No significant fibrosis.   Liver biopsy in 2010 revealed marked fibrosis concerning for cirrhosis. No significant steatosis or cholestasis.   In 2009 liver biopsy revealed massive hepatocellular necrosis. 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 The hepatocellular destruction is extensive and determining the etiology at this point is difficult. Differential  diagnosis is broad and includes autoimmune hepatitis, viral hepatitis, the effect of drugs/medications, herbal remedies, infectious etiologies and Wilson disease, Alpha 1 antitrypsin  Acute Hepatitis panel negative 01/07/2014   GAD Has not tried anything the past due to not wanting to take medications. Reports chronic issue with constant worrying and anxiety especially in new places. Is open to consider PRN medication.  PHQ2 score today is 0 GAD7 score is 15 (severe)  LBP and joint pain Chronic issue with intermittent symptoms. Pain is located on lower mid back and pelvic ligaments. Feel stiff at times. Does not take NSAID or Tylenol only if she really needs it.  Denies: bowel or bladder incontinence, numbness in saddle area, severe tearing pain, cancer history, fever, recent unintential weight loss, or IVDU  Lumbar XR 05-2019 IMPRESSION:  1. No acute bony abnormality.  2. Mild-to-moderate facet degenerative changes in the lower lumbar  spine.  3. Partial sacralization of the L5 transverse processes. Could  correlate for clinical features of Bertolotti syndrome in the  setting of chronic back pain.   Health Maintenance: Vaccines: deferred PCV.  Colonoscopy: Overdue, ordered today.  Mammogram: Following GYN, scheduled next year Pap: Following GYN, scheduled next year    Reviewed and updated this visit by provider: Tobacco  Allergies  Meds  Problems  Med Hx  Surg Hx  Fam Hx       Review of Systems  Constitutional:  Positive for fatigue and unexpected weight change (weight gain 20+ lbs). Negative for chills and fever.  Respiratory:  Negative for shortness of breath and wheezing.   Cardiovascular:  Negative for chest  pain, palpitations and leg swelling.  Musculoskeletal:  Positive for arthralgias.  Psychiatric/Behavioral:  Positive for sleep disturbance. Negative for dysphoric mood. The patient is nervous/anxious.      Objective   BP 147/67 (BP Location: Left Upper Arm,  Patient Position: Sitting)   Pulse 53   Ht 5' 3 (1.6 m)   Wt 175 lb (79.4 kg)   SpO2 97%   BMI 31.00 kg/m   Physical Exam Constitutional:      General: She is not in acute distress.    Appearance: Normal appearance. She is obese. She is not ill-appearing.  Cardiovascular:     Rate and Rhythm: Normal rate and regular rhythm.     Pulses: Normal pulses.     Heart sounds: Normal heart sounds.  Musculoskeletal:        General: No swelling or deformity.     Thoracic back: Normal. No tenderness.     Lumbar back: No swelling, edema, deformity, spasms or tenderness. Normal range of motion.     Right lower leg: No edema.     Left lower leg: No edema.  Pulmonary:     Effort: Pulmonary effort is normal.     Breath sounds: Normal breath sounds.  Abdominal:     Palpations: Abdomen is soft.     Tenderness: There is no abdominal tenderness.  Skin:    Findings: No rash.  Neurological:     Mental Status: She is alert and oriented to person, place, and time.  Psychiatric:        Behavior: Behavior normal.     Assessment/Plan   1. Establishing care with new doctor, encounter for (Primary) - Reviewed past labs and PCP notes on file.  2. White coat syndrome with diagnosis of hypertension - Elevated BP today, asymptomatic. Given BP log to keep track each morning for the next 2 weeks and present for review at next visit. BP goal <140/90. -     Comprehensive Metabolic Panel; Future; Expected date: 11/13/2024 3. Stage 3a chronic kidney disease (*) - Reviewed nephrologist notes on file. Stable CKD, plan to obtain labs as ordered. Continue with ARB and avoid renal toxic medications. Increase in water intake >64oz/day.  -     Comprehensive Metabolic Panel; Future; Expected date: 11/13/2024 -     Fe+TIBC+Fer; Future; Expected date: 08/16/2024 4. Autoimmune hepatitis (*) - Reviewed past records on file. Asymptomatic, likely in remission. Agreed to labs today and surveillance liver US . Referral to  GI placed. -     Lipid Panel; Future; Expected date: 11/13/2024 -     Comprehensive Metabolic Panel; Future; Expected date: 11/13/2024 -     CBC; Future -     Ambulatory referral to Gastroenterology; Future -     Fe+TIBC+Fer; Future; Expected date: 08/16/2024 -     Hepatic function panel; Future -     US  Liver; Future 5. GAD (generalized anxiety disorder) - GAD7 score is severe, reviewed management options. Does not want a daily regimen but open to PRN regimen. Agreed with trail of buspar as prescribed. Follow-up in 4 weeks if symptoms persist.  -     busPIRone (BUSPAR) 5 mg tablet; Take one tablet (5 mg dose) by mouth 2 (two) times a day as needed., Starting Thu 05/16/2024, Normal 6. Class 1 obesity due to excess calories with serious comorbidity and body mass index (BMI) of 31.0 to 31.9 in adult - Briefly discussed weight management options. Not candidate for phentermine or metformin with GAD and CKD hx.  Candidate for GLP-1 or Topamax . Prefers oral regimen. Agreed to follow-up with nephrologist for clearance on Topamax  and contact us  on decision. Goal to maintain at least 30 minutes of physical activity 4-5x/week. Incorporate healthy food options and snacks. -     Lipid Panel; Future; Expected date: 11/13/2024 -     Comprehensive Metabolic Panel; Future; Expected date: 11/13/2024 -     Hemoglobin A1c; Future 7. Other iron deficiency anemia -     CBC; Future -     Fe+TIBC+Fer; Future; Expected date: 08/16/2024 8. Fatigue, unspecified type -     Comprehensive Metabolic Panel; Future; Expected date: 11/13/2024 -     CBC; Future 9. Hypercholesterolemia -     Lipid Panel; Future; Expected date: 11/13/2024 10. Screening for endocrine, metabolic and immunity disorder -     Comprehensive Metabolic Panel; Future; Expected date: 11/13/2024 -     CBC; Future -     Hemoglobin A1c; Future 11. Colon cancer screening -     Ambulatory referral to Gastroenterology; Future 12. Immunization due -      zoster vaccine recombinant  adjuvanted (SHINGRIX ) 50 mcg/0.5 mL injection; Inject 0.5 mLs into the muscle once for 1 dose., Starting Thu 05/16/2024, Print 13. Degeneration of intervertebral disc of lumbar region with discogenic back pain   - No acute signs on exam or HPI. Reviewed management options today. Recommended lidocaine patches and/or OTC voltaren gel PRN and optimizing in daily stretching. Recommended referral to PT - patient declined at this time. Wants to wait until next OV.    Risks, benefits, and alternatives of the medications and treatment plan prescribed today were discussed, and patient expressed understanding. Follow up in about 6 months (around 11/13/2024) for labs taken before office visit.  Documentation for time-based billing:  Total time spent of date of service was 60 minutes.  Patient care activities included preparing to see the patient such as reviewing the patient record, performing a medically appropriate history and physical examination, counseling and educating the patient, family, and/or caregiver, ordering prescription medications, tests, or procedures, documenting clinical information in the electronic or other health record, and communicating results to the patient/family/caregiver.   Orders Placed This Encounter  Procedures  . US  Liver  . Lipid Panel  . Comprehensive Metabolic Panel  . CBC  . Hemoglobin A1c  . Fe+TIBC+Fer  . Hepatic function panel  . Ambulatory referral to Gastroenterology   Patient's Medications  New Prescriptions   BUSPIRONE (BUSPAR) 5 MG TABLET    Take one tablet (5 mg dose) by mouth 2 (two) times a day as needed.   ZOSTER VACCINE RECOMBINANT  ADJUVANTED (SHINGRIX ) 50 MCG/0.5 ML INJECTION    Inject 0.5 mLs into the muscle once for 1 dose.  Previous Medications   ACETAMINOPHEN (TYLENOL PO)    Take by mouth.   CARVEDILOL  (COREG ) 25 MG TABLET    Take one tablet (25 mg dose) by mouth 2 (two) times daily.   CHLORTHALIDONE 25 MG TABLET    Take  one half tablet (12.5 mg dose) by mouth daily.   VALSARTAN  (DIOVAN ) 40 MG TABLET    Take one tablet (40 mg dose) by mouth daily.  Modified Medications   No medications on file  Discontinued Medications   AMLODIPINE BESYLATE (NORVASC) 5 MG TABLET    Take one tablet (5 mg dose) by mouth daily.   ASHWAGANDHA PO    Take 2 tablets by mouth daily.   CHOLECALCIFEROL (VITAMIN D3) 100 MCG (4000 UT) CAPS CAPSULE  Take three capsules (12,000 Units dose) by mouth daily.   WHEAT DEXTRIN (BENEFIBER DRINK MIX) PACK    Take 1 packet by mouth daily.

## 2024-06-11 ENCOUNTER — Encounter (INDEPENDENT_AMBULATORY_CARE_PROVIDER_SITE_OTHER): Payer: Self-pay | Admitting: *Deleted

## 2024-07-15 ENCOUNTER — Encounter (INDEPENDENT_AMBULATORY_CARE_PROVIDER_SITE_OTHER): Payer: Self-pay | Admitting: Gastroenterology

## 2024-07-15 ENCOUNTER — Ambulatory Visit (INDEPENDENT_AMBULATORY_CARE_PROVIDER_SITE_OTHER): Admitting: Gastroenterology

## 2024-07-15 VITALS — BP 149/92 | HR 86 | Temp 97.9°F | Ht 63.0 in | Wt 172.1 lb

## 2024-07-15 DIAGNOSIS — K754 Autoimmune hepatitis: Secondary | ICD-10-CM | POA: Diagnosis not present

## 2024-07-15 DIAGNOSIS — Z1211 Encounter for screening for malignant neoplasm of colon: Secondary | ICD-10-CM | POA: Insufficient documentation

## 2024-07-15 DIAGNOSIS — R748 Abnormal levels of other serum enzymes: Secondary | ICD-10-CM | POA: Diagnosis not present

## 2024-07-15 DIAGNOSIS — I10 Essential (primary) hypertension: Secondary | ICD-10-CM

## 2024-07-15 NOTE — Patient Instructions (Addendum)
 It was very nice to meet you today, as dicussed with will plan for the following :  1) labs  2) colonoscopy

## 2024-07-15 NOTE — Progress Notes (Signed)
 Edwardo Wojnarowski Faizan Esai Stecklein , M.D. Gastroenterology & Hepatology [facility] [address] [place], [place] [address] Primary Care Physician: Dennise Corean BIRCH, FNP [address] [place] [address]  Chief Complaint: Autoimmune hepatitis/elevated liver enzymes and colon cancer screening  History of Present Illness:  Karen Robinson is a 55 y.o. female with CKD stage III, hypertension, autoimmune hepatitis 2008 who presents for evaluation of Autoimmune hepatitis/elevated liver enzymes and colon cancer screening  Patient does not have any active complaints.  Previously had colonoscopy performed over 10 years ago by Dr. Golda when she was diagnosed with autoimmune hepatitis Regarding the patient's history of autoimmune hepatitis she was last seen at [facility] hepatology in 2015 per notes diagnosed 05/2007 with liver biopsy demonstrating marked fibrosis, bile duct proliferation and mild to moderate inflammatory infiltrate .  Previously patient was on azathioprine 75 mg for atleast 5 years.  Biopsies at that time per notes was concerning for cirrhosis/advanced fibrosis and there was plan for repeat biopsy prior to discontinuation of azathioprine as patient wanted to stop taking medication.  Patient has not followed up with GI since 2018 and has not been on any maintenance treatment since than  Denies any confusion, denies itching or yellowing of the skin.  Recently starting over-the-counter supplementation but that was only last week and lab values were abnormal even before that The patient denies having any nausea, vomiting, fever, chills, hematochezia, melena, hematemesis, abdominal distention, abdominal pain, diarrhea, jaundice, pruritus or weight loss.  Last ZHI:wnwz Last Colonoscopy:10+ years ago   FHx: neg for any gastrointestinal/liver disease, no malignancies Social: neg smoking, alcohol or illicit drug use Surgical: Cholecystectomy  Labs  from 02/05/2024 ferritin 154 iron saturation 18%  Elevated liver enzymes AST 44 ALT 35 alk phos 74 T. bili 0.5 Hemoglobin A1c 5.7  Past Medical History: Past Medical History:  Diagnosis Date   Autoimmune hepatitis (HCC)    Hypertension     Past Surgical History: Past Surgical History:  Procedure Laterality Date   CESAREAN SECTION     CHOLECYSTECTOMY      Family History: Family History  Problem Relation Age of Onset   Hypertension Mother    Hypertension Maternal Grandmother    Stroke Maternal Grandmother     Social History:Tobacco Use History[1] Social History   Substance and Sexual Activity  Alcohol Use No   Social History   Substance and Sexual Activity  Drug Use No    Allergies: Allergies[2]  Medications: Current Outpatient Medications  Medication Sig Dispense Refill   carvedilol  (COREG ) 25 MG tablet Take 25 mg by mouth 2 (two) times daily.     chlorthalidone (HYGROTON) 25 MG tablet Take 25 mg by mouth daily. Take 0.5 tablets by mouth daily     valsartan  (DIOVAN ) 40 MG tablet Take 40 mg by mouth daily. Take 0.5 tablets by mouth daily     No current facility-administered medications for this visit.    Review of Systems: GENERAL: negative for malaise, night sweats HEENT: No changes in hearing or vision, no nose bleeds or other nasal problems. NECK: Negative for lumps, goiter, pain and significant neck swelling RESPIRATORY: Negative for cough, wheezing CARDIOVASCULAR: Negative for chest pain, leg swelling, palpitations, orthopnea GI: SEE HPI MUSCULOSKELETAL: Negative for joint pain or swelling, back pain, and muscle pain. SKIN: Negative for lesions, rash HEMATOLOGY Negative for prolonged bleeding, bruising easily, and swollen nodes. ENDOCRINE: Negative for cold or heat intolerance, polyuria, polydipsia and goiter. NEURO: negative for tremor, gait imbalance,  syncope and seizures. The remainder of the review of systems is noncontributory.   Physical  Exam: BP (!) 149/92   Pulse 86   Temp 97.9 F (36.6 C)   Ht 5' 3 (1.6 m)   Wt 172 lb 1.6 oz (78.1 kg)   BMI 30.49 kg/m  GENERAL: The patient is AO x3, in no acute distress. HEENT: Head is normocephalic and atraumatic. EOMI are intact. Mouth is well hydrated and without lesions. NECK: Supple. No masses LUNGS: Clear to auscultation. No presence of rhonchi/wheezing/rales. Adequate chest expansion HEART: RRR, normal s1 and s2. ABDOMEN: Soft, nontender, no guarding, no peritoneal signs, and nondistended. BS +. No masses.  Imaging/Labs: as above     Latest Ref Rng & Units 09/19/2022    8:08 AM 02/25/2019    8:41 AM 02/15/2018    4:33 PM  CBC  WBC 3.4 - 10.8 x10E3/uL 7.6  6.6  6.7   Hemoglobin 11.1 - 15.9 g/dL 87.4  88.0  87.8   Hematocrit 34.0 - 46.6 % 37.0  36.0  37.7   Platelets 150 - 450 x10E3/uL 230  235  273    No results found for: IRON, TIBC, FERRITIN  I personally reviewed and interpreted the available labs, imaging and endoscopic files.  Ultrasound 06/2024 IMPRESSION:  1. The visualized liver appears mildly echogenic, suggesting possible hepatic steatosis or other parenchymal disease.  2.  Prior cholecystectomy.  3.  Limited visualization of the right kidney.   a. Liver biopsy (06/27/2007) with marked portal, interface and lobular inflammation, proliferating bile ducts and focal fibrosis on trichrome stain ('cores consist mostly of proliferating bile ducts with add mixed inflammatory infiltrate including plasma cells. There are few foci of lobules with hepatocytes showing degenerative changes and focal necrosis. There are no granulomas observed').     Impression and Plan:  Karen Robinson is a 54 y.o. female with CKD stage III, hypertension, autoimmune hepatitis 2008 who presents for evaluation of Autoimmune hepatitis/elevated liver enzymes and colon cancer screening  #Elevated liver enzymes # History of autoimmune hepatitis  Patient last biopsy was in 2015.   Biopsy in 2010 was concerning for cirrhosis which itself is an independent absolute indicator for treatment if patient truly has underlying autoimmune hepatitis  She has not seen GI at least since 2018 and not has been on any maintenance medication.  Previously was on steroids and last on azathioprine 75 mg  Most recent lab values with liver enzymes at least 2 times upper normal limit  serum AST and ALT concentrations do not reliably indicate underlying necroinflammation.  Liver biopsy remains the best method for evaluating disease activity and determining treatment need  The primary goals of treatment are to improve symptoms, control hepatic inflammation, achieve biochemical remission (normalization of serum ALT, AST, and IgG levels), prevent disease progression, and promote regression of fibrosis.  Absolute indications for treatment remains: Serum AST or ALT exceeding ten times the upper limit of normal, Histological evidence of bridging or multilobular necrosis and Severe hepatic or extrahepatic symptoms  We will obtain autoimmune serology with ANA/ASMA and IgG level.  As per above I discussed with patient the clinical indication for liver biopsy to assess for any underlying fibrosis or necrosis to pursue treatment.    The risks, benefits, and alternatives of liver biopsy were reviewed with the patient. Potential risks include pain at the biopsy site, bleeding or hematoma formation, infection, injury to adjacent organs (including lung, gallbladder, or kidney), bile leak, hemobilia, pneumothorax, need for blood  transfusion, hospitalization, or repeat intervention. Rare but serious complications include severe hemorrhage and death. The patient voiced understanding of these risks and Patient will think about biopsy.  #Colon cancer screening  The patient was counseled regarding the importance of colorectal cancer screening The benefits of screening include early detection of colorectal cancer and  precancerous polyps, which can improve treatment outcomes and reduce mortality. Risks associated with screening, particularly colonoscopy, include potential complications such as bleeding and perforation. After deciding different modalities for screening for colon cancer , patient has opted to pursue Colonoscopy   #HTN The patient was found to have elevated blood pressure when vital signs were checked in the office. The blood pressure was rechecked by the nursing staff and it was found be persistently elevated >140/90 mmHg. I personally advised to the patient to follow up closely with PCP for hypertension control.   All questions were answered.      Ahkeem Goede Faizan Akeria Hedstrom, MD Gastroenterology and Hepatology [facility] Gastroenterology   This chart has been completed using [facility] Dictation software, and while attempts have been made to ensure accuracy , certain words and phrases may not be transcribed as intended      [1]  Social History Tobacco Use  Smoking Status Never  Smokeless Tobacco Never  [2]  Allergies Allergen Reactions   Nifedipine Swelling    Made her gums swell

## 2024-07-16 ENCOUNTER — Telehealth (INDEPENDENT_AMBULATORY_CARE_PROVIDER_SITE_OTHER): Payer: Self-pay

## 2024-07-16 MED ORDER — PEG 3350-KCL-NA BICARB-NACL 420 G PO SOLR: 4000.0000 mL | Freq: Once | ORAL | 0 refills | Status: AC

## 2024-07-16 NOTE — Telephone Encounter (Signed)
 Spoke with patient, scheduled TCS for 08/16/2024 at 9am. Rx sent to pharmacy. Instructions mailed.

## 2024-07-16 NOTE — Telephone Encounter (Signed)
 Prior shara is not required on carelon

## 2024-07-19 LAB — COMPREHENSIVE METABOLIC PANEL WITH GFR
ALT: 20 IU/L (ref 0–32)
AST: 22 IU/L (ref 0–40)
Albumin: 4.6 g/dL (ref 3.8–4.9)
Alkaline Phosphatase: 85 IU/L (ref 49–135)
BUN/Creatinine Ratio: 11 (ref 9–23)
BUN: 12 mg/dL (ref 6–24)
Bilirubin Total: 0.4 mg/dL (ref 0.0–1.2)
CO2: 27 mmol/L (ref 20–29)
Calcium: 10.1 mg/dL (ref 8.7–10.2)
Chloride: 101 mmol/L (ref 96–106)
Creatinine, Ser: 1.09 mg/dL — ABNORMAL HIGH (ref 0.57–1.00)
Globulin, Total: 3.3 g/dL (ref 1.5–4.5)
Glucose: 92 mg/dL (ref 70–99)
Potassium: 3.8 mmol/L (ref 3.5–5.2)
Sodium: 143 mmol/L (ref 134–144)
Total Protein: 7.9 g/dL (ref 6.0–8.5)
eGFR: 60 mL/min/1.73

## 2024-07-19 LAB — ANTI-SMOOTH MUSCLE ANTIBODY, IGG: Smooth Muscle Ab: 8 U (ref 0–19)

## 2024-07-19 LAB — HIV ANTIBODY (ROUTINE TESTING W REFLEX): HIV Screen 4th Generation wRfx: NONREACTIVE

## 2024-07-19 LAB — ENHANCED LIVER FIBROSIS (ELF): ELF(TM) Score: 10.12 — AB

## 2024-07-19 LAB — PROTIME-INR
INR: 1 (ref 0.9–1.2)
Prothrombin Time: 10.5 s (ref 9.1–12.0)

## 2024-07-19 LAB — IGG, IGA, IGM
IgG (Immunoglobin G), Serum: 1568 mg/dL (ref 586–1602)
IgM (Immunoglobulin M), Srm: 82 mg/dL (ref 26–217)
Immunoglobulin A, (IgA) QN, Serum: 489 mg/dL — ABNORMAL HIGH (ref 87–352)

## 2024-07-19 LAB — HEPATITIS B CORE ANTIBODY, TOTAL: Hep B Core Total Ab: NEGATIVE

## 2024-07-19 LAB — HEPATITIS C ANTIBODY: Hep C Virus Ab: NONREACTIVE

## 2024-07-19 LAB — HEPATITIS B SURFACE ANTIBODY,QUALITATIVE: Hep B Surface Ab, Qual: REACTIVE

## 2024-07-19 LAB — HEPATITIS A ANTIBODY, TOTAL: hep A Total Ab: POSITIVE — AB

## 2024-07-19 LAB — HEPATITIS B SURFACE ANTIGEN: Hepatitis B Surface Ag: NEGATIVE

## 2024-07-19 LAB — ANA: ANA Titer 1: NEGATIVE

## 2024-07-19 LAB — MITOCHONDRIAL ANTIBODIES: Mitochondrial Ab: <20 U (ref 0.0–20.0)

## 2024-07-23 ENCOUNTER — Ambulatory Visit (INDEPENDENT_AMBULATORY_CARE_PROVIDER_SITE_OTHER): Payer: Self-pay | Admitting: Gastroenterology

## 2024-07-23 NOTE — Progress Notes (Signed)
 Labs IgG 1568 and IgM 82; within normal limits Normal liver enzymes  Hepatitis A immune Hep B core antibody negative Hep B surface antibody positive Hep B surface antigen negative Hep C negative  HIV negative Elf score 10.12 (mid risk), suggesting advanced fibrosis (F3-F4)   Negative ANA, AMA, ASMA INR 1.0  =====  Rec:   Obtain FibroScan in future

## 2024-07-23 NOTE — Telephone Encounter (Signed)
 Patient called today asking about her recent testing. I made her aware Dr. Cinderella had sent her a My Chart message, which she says she did not read, but I did make her aware per Dr. Cinderella, Your lab work shows normal liver enzymes and serologies for autoimmune hepatitis are not elevated which is a good news.  Although lab work suggest advanced fibrosis/scarring of the liver .  I recommend continue with healthy lifestyle with Mediterranean diet and exercise.  I would recommend a FibroScan in future to assess the degree of scarring in your liver.  I look forward to seeing you on day of colonoscopy 2/20 Patient states understanding.

## 2024-08-16 ENCOUNTER — Ambulatory Visit (HOSPITAL_COMMUNITY): Admission: RE | Admit: 2024-08-16 | Source: Home / Self Care | Admitting: Gastroenterology

## 2024-08-16 ENCOUNTER — Encounter (HOSPITAL_COMMUNITY): Admission: RE | Payer: Self-pay | Source: Home / Self Care

## 2024-08-16 SURGERY — COLONOSCOPY
Anesthesia: Choice
